# Patient Record
Sex: Male | Born: 1980 | Race: Black or African American | Hispanic: No | Marital: Single | Smoking: Former smoker
Health system: Southern US, Community
[De-identification: ages and names within clinical notes are randomized; demographics above are authoritative.]

## PROBLEM LIST (undated history)

## (undated) ENCOUNTER — Emergency Department: Admission: EM | Payer: Self-pay | Source: Home / Self Care

## (undated) DIAGNOSIS — R29898 Other symptoms and signs involving the musculoskeletal system: Secondary | ICD-10-CM

## (undated) DIAGNOSIS — J189 Pneumonia, unspecified organism: Secondary | ICD-10-CM

## (undated) DIAGNOSIS — G839 Paralytic syndrome, unspecified: Secondary | ICD-10-CM

## (undated) DIAGNOSIS — G8929 Other chronic pain: Secondary | ICD-10-CM

## (undated) DIAGNOSIS — M5030 Other cervical disc degeneration, unspecified cervical region: Secondary | ICD-10-CM

## (undated) HISTORY — DX: Other symptoms and signs involving the musculoskeletal system: R29.898

## (undated) HISTORY — PX: OTHER SURGICAL HISTORY: SHX169

## (undated) HISTORY — DX: Other chronic pain: G89.29

---

## 2006-12-21 ENCOUNTER — Emergency Department (HOSPITAL_COMMUNITY): Admission: EM | Admit: 2006-12-21 | Discharge: 2006-12-21 | Payer: Self-pay | Admitting: *Deleted

## 2009-08-21 ENCOUNTER — Emergency Department (HOSPITAL_COMMUNITY): Admission: EM | Admit: 2009-08-21 | Discharge: 2009-08-21 | Payer: Self-pay | Admitting: Emergency Medicine

## 2009-12-18 ENCOUNTER — Emergency Department (HOSPITAL_COMMUNITY): Admission: EM | Admit: 2009-12-18 | Discharge: 2009-12-18 | Payer: Self-pay | Admitting: Emergency Medicine

## 2009-12-23 ENCOUNTER — Emergency Department (HOSPITAL_COMMUNITY): Admission: EM | Admit: 2009-12-23 | Discharge: 2009-12-23 | Payer: Self-pay | Admitting: Emergency Medicine

## 2010-12-18 ENCOUNTER — Emergency Department (HOSPITAL_COMMUNITY)
Admission: EM | Admit: 2010-12-18 | Discharge: 2010-12-18 | Disposition: A | Discharge status: Home / Self Care | Payer: Self-pay | Attending: Emergency Medicine | Admitting: Emergency Medicine

## 2010-12-18 ENCOUNTER — Emergency Department (HOSPITAL_COMMUNITY): Payer: Self-pay

## 2010-12-18 DIAGNOSIS — M79609 Pain in unspecified limb: Secondary | ICD-10-CM | POA: Insufficient documentation

## 2010-12-18 DIAGNOSIS — M7989 Other specified soft tissue disorders: Secondary | ICD-10-CM | POA: Insufficient documentation

## 2010-12-18 DIAGNOSIS — W2203XA Walked into furniture, initial encounter: Secondary | ICD-10-CM | POA: Insufficient documentation

## 2010-12-18 DIAGNOSIS — S62339A Displaced fracture of neck of unspecified metacarpal bone, initial encounter for closed fracture: Secondary | ICD-10-CM | POA: Insufficient documentation

## 2011-01-16 LAB — URINE MICROSCOPIC-ADD ON

## 2011-01-16 LAB — URINALYSIS, ROUTINE W REFLEX MICROSCOPIC
Bilirubin Urine: NEGATIVE
Glucose, UA: NEGATIVE mg/dL
Hgb urine dipstick: NEGATIVE
Nitrite: NEGATIVE
Urobilinogen, UA: 1 mg/dL (ref 0.0–1.0)

## 2012-11-29 ENCOUNTER — Encounter (HOSPITAL_COMMUNITY): Payer: Self-pay | Admitting: Emergency Medicine

## 2012-11-29 ENCOUNTER — Emergency Department (HOSPITAL_COMMUNITY)
Admission: EM | Admit: 2012-11-29 | Discharge: 2012-11-29 | Disposition: A | Discharge status: Home / Self Care | Payer: Self-pay | Attending: Emergency Medicine | Admitting: Emergency Medicine

## 2012-11-29 DIAGNOSIS — R21 Rash and other nonspecific skin eruption: Secondary | ICD-10-CM | POA: Insufficient documentation

## 2012-11-29 DIAGNOSIS — L02519 Cutaneous abscess of unspecified hand: Secondary | ICD-10-CM | POA: Insufficient documentation

## 2012-11-29 DIAGNOSIS — L03119 Cellulitis of unspecified part of limb: Secondary | ICD-10-CM

## 2012-11-29 DIAGNOSIS — L03019 Cellulitis of unspecified finger: Secondary | ICD-10-CM | POA: Insufficient documentation

## 2012-11-29 MED ORDER — OXYCODONE-ACETAMINOPHEN 5-325 MG PO TABS: ORAL_TABLET | ORAL | Status: DC

## 2012-11-29 MED ORDER — SULFAMETHOXAZOLE-TMP DS 800-160 MG PO TABS
1.0000 | ORAL_TABLET | Freq: Once | ORAL | Status: AC
  Administered 2012-11-29: 1 via ORAL
  Filled 2012-11-29: qty 1

## 2012-11-29 MED ORDER — OXYCODONE-ACETAMINOPHEN 5-325 MG PO TABS
2.0000 | ORAL_TABLET | Freq: Once | ORAL | Status: AC
  Administered 2012-11-29: 2 via ORAL
  Filled 2012-11-29: qty 2

## 2012-11-29 MED ORDER — SULFAMETHOXAZOLE-TRIMETHOPRIM 800-160 MG PO TABS: 1.0000 | ORAL_TABLET | Freq: Two times a day (BID) | ORAL | Status: DC

## 2012-11-29 NOTE — ED Provider Notes (Signed)
History     CSN: [id number]  Arrival date & time 11/29/12  8295   First MD Initiated Contact with Patient 11/29/12 1104      Chief Complaint  Patient presents with  . Abscess    (Consider location/radiation/quality/duration/timing/severity/associated sxs/prior treatment) HPI  Vincent Stout is a 32 y.o. male complaining of enlarging abscess to right second digit proximal phalanx worsening over the course of 3 days. Patient cannot recall any trauma to the area. He denies fever, nausea or vomiting. Patient had an abscess in the right axilla recently. Mildly reduced range of motion. Patient tried to open it himself yesterday.  History reviewed. No pertinent past medical history.  History reviewed. No pertinent past surgical history.  History reviewed. No pertinent family history.  History  Substance Use Topics  . Smoking status: Never Smoker   . Smokeless tobacco: Not on file  . Alcohol Use: No      Review of Systems  Constitutional: Negative for fever.  Respiratory: Negative for shortness of breath.   Cardiovascular: Negative for chest pain.  Gastrointestinal: Negative for nausea, vomiting, abdominal pain and diarrhea.  Skin: Positive for rash.  All other systems reviewed and are negative.    Allergies  Review of patient's allergies indicates no known allergies.  Home Medications  No current outpatient prescriptions on file.  BP 124/79  Pulse 65  Temp 97.6 F (36.4 C) (Oral)  Resp 20  SpO2 97%  Physical Exam  Nursing note and vitals reviewed. Constitutional: He is oriented to person, place, and time. He appears well-developed and well-nourished. No distress.  HENT:  Head: Normocephalic.  Eyes: Conjunctivae normal and EOM are normal.  Cardiovascular: Normal rate.   Pulmonary/Chest: Effort normal. No stridor.  Musculoskeletal: Normal range of motion.  Neurological: He is alert and oriented to person, place, and time.  Skin:       Fluctuant abscess to  right second digit proximal phalanx on the radial side approximately 1.5 cm. Mild surrounding induration. Reduced range of motion of the PIP. Distal sensation is grossly intact and cap refill is less than 2 seconds.  Psychiatric: He has a normal mood and affect.    ED Course  Procedures (including critical care time)  INCISION AND DRAINAGE Performed by: Wynetta Emery Consent: Verbal consent obtained. Risks and benefits: risks, benefits and alternatives were discussed Type: abscess  Body area: Right first digit proximal phalanx on the radial side  Anesthesia: Digital block  Incision was made with a scalpel.  anesthetic: lidocaine 2% without epinephrine  Anesthetic total: 3 ml  Complexity: complex Blunt dissection to break up loculations  Drainage: purulent  Drainage amount: Half milliliter   Packing material: None   Patient tolerance: Patient tolerated the procedure well with no immediate complications.    Labs Reviewed - No data to display No results found.   1. Cellulitis and abscess of hand       MDM  1 cm abscess to right first digit on the lateral side. Doubt tenosynovitis or joint space involvement. Discussed case with attending  who agrees with care plan to perform incision and drainage. I indeed yielded scant amount of purulent  material. Abscess was cultured. Patient will receive his first dose of Bactrim in ED. Explicit return precautions given.  Filed Vitals:   11/29/12 1001  BP: 124/79  Pulse: 65  Temp: 97.6 F (36.4 C)  TempSrc: Oral  Resp: 20  SpO2: 97%     Pt verbalized understanding and agrees with care plan.  Outpatient follow-up and return precautions given.    New Prescriptions   OXYCODONE-ACETAMINOPHEN (PERCOCET/ROXICET) 5-325 MG PER TABLET    1 to 2 tabs PO q6hrs  PRN for pain   SULFAMETHOXAZOLE-TRIMETHOPRIM (SEPTRA DS) 800-160 MG PER TABLET    Take 1 tablet by mouth every 12 (twelve) hours.        Wynetta Emery,  PA-C 11/29/12 1217

## 2012-11-29 NOTE — ED Notes (Signed)
Pt c/o right index finger abscess x 3 days with pain and swelling

## 2012-12-02 ENCOUNTER — Telehealth (HOSPITAL_COMMUNITY): Payer: Self-pay | Admitting: Emergency Medicine

## 2012-12-02 LAB — CULTURE, ROUTINE-ABSCESS: Special Requests: NORMAL

## 2012-12-02 NOTE — ED Provider Notes (Signed)
Medical screening examination/treatment/procedure(s) were performed by non-physician practitioner and as supervising physician I was immediately available for consultation/collaboration.   Jamari Moten Y. Tiffay Pinette, MD 12/02/12 1440 

## 2012-12-02 NOTE — ED Notes (Signed)
Results called from [facility].  Abscess Rt Hand -> (+) Moderate MRSA.  Rx given in ED for Sulfa-Trimeth -> sensitive to the same.  Call and notify pt.  General FYI set for MRSA Hx.

## 2012-12-06 ENCOUNTER — Telehealth (HOSPITAL_COMMUNITY): Payer: Self-pay | Admitting: Emergency Medicine

## 2012-12-06 NOTE — ED Notes (Signed)
Unable to contact patient via phone. Sent letter. °

## 2013-01-17 ENCOUNTER — Telehealth (HOSPITAL_COMMUNITY): Payer: Self-pay | Admitting: Emergency Medicine

## 2013-01-17 NOTE — ED Notes (Signed)
No response to letter sent after 30 days. Chart sent to Medical Records. °

## 2014-09-30 ENCOUNTER — Other Ambulatory Visit: Payer: Self-pay

## 2014-09-30 ENCOUNTER — Encounter (HOSPITAL_COMMUNITY): Payer: Self-pay | Admitting: Emergency Medicine

## 2014-09-30 ENCOUNTER — Emergency Department (HOSPITAL_COMMUNITY)

## 2014-09-30 ENCOUNTER — Emergency Department (HOSPITAL_COMMUNITY)
Admission: EM | Admit: 2014-09-30 | Discharge: 2014-09-30 | Disposition: A | Discharge status: Home / Self Care | Attending: Emergency Medicine | Admitting: Emergency Medicine

## 2014-09-30 DIAGNOSIS — Z792 Long term (current) use of antibiotics: Secondary | ICD-10-CM | POA: Diagnosis not present

## 2014-09-30 DIAGNOSIS — Z79899 Other long term (current) drug therapy: Secondary | ICD-10-CM | POA: Insufficient documentation

## 2014-09-30 DIAGNOSIS — R079 Chest pain, unspecified: Secondary | ICD-10-CM | POA: Diagnosis present

## 2014-09-30 DIAGNOSIS — Z87891 Personal history of nicotine dependence: Secondary | ICD-10-CM | POA: Insufficient documentation

## 2014-09-30 DIAGNOSIS — Z7982 Long term (current) use of aspirin: Secondary | ICD-10-CM | POA: Insufficient documentation

## 2014-09-30 LAB — CBC WITH DIFFERENTIAL/PLATELET
Basophils Absolute: 0 K/uL (ref 0.0–0.1)
Basophils Relative: 1 % (ref 0–1)
Eosinophils Absolute: 0.1 K/uL (ref 0.0–0.7)
Eosinophils Relative: 2 % (ref 0–5)
HCT: 44.1 % (ref 39.0–52.0)
Hemoglobin: 15.3 g/dL (ref 13.0–17.0)
Lymphocytes Relative: 45 % (ref 12–46)
Lymphs Abs: 3.7 K/uL (ref 0.7–4.0)
MCH: 30.3 pg (ref 26.0–34.0)
MCHC: 34.7 g/dL (ref 30.0–36.0)
MCV: 87.3 fL (ref 78.0–100.0)
Monocytes Absolute: 0.7 K/uL (ref 0.1–1.0)
Monocytes Relative: 8 % (ref 3–12)
Neutro Abs: 3.6 K/uL (ref 1.7–7.7)
Neutrophils Relative %: 44 % (ref 43–77)
Platelets: 211 K/uL (ref 150–400)
RBC: 5.05 MIL/uL (ref 4.22–5.81)
RDW: 12.8 % (ref 11.5–15.5)
WBC: 8.2 K/uL (ref 4.0–10.5)

## 2014-09-30 LAB — TROPONIN I

## 2014-09-30 LAB — BASIC METABOLIC PANEL WITH GFR
Anion gap: 11 (ref 5–15)
BUN: 10 mg/dL (ref 6–23)
CO2: 27 meq/L (ref 19–32)
Calcium: 9.4 mg/dL (ref 8.4–10.5)
Chloride: 103 meq/L (ref 96–112)
Creatinine, Ser: 1.25 mg/dL (ref 0.50–1.35)
GFR calc Af Amer: 87 mL/min — ABNORMAL LOW
GFR calc non Af Amer: 75 mL/min — ABNORMAL LOW
Glucose, Bld: 90 mg/dL (ref 70–99)
Potassium: 4 meq/L (ref 3.7–5.3)
Sodium: 141 meq/L (ref 137–147)

## 2014-09-30 MED ORDER — ACETAMINOPHEN 500 MG PO TABS
1000.0000 mg | ORAL_TABLET | Freq: Once | ORAL | Status: AC
Start: 2014-09-30 — End: 2014-09-30
  Administered 2014-09-30: 1000 mg via ORAL

## 2014-09-30 MED ORDER — IBUPROFEN 800 MG PO TABS
800.0000 mg | ORAL_TABLET | Freq: Once | ORAL | Status: AC
  Administered 2014-09-30: 800 mg via ORAL

## 2014-09-30 NOTE — Discharge Instructions (Signed)
Tests were normal. Tylenol and/or ibuprofen for headache.

## 2014-09-30 NOTE — ED Notes (Signed)
Patient c/o mid-sternal chest pain that started PTA. States that he "was stressing" when it started. Denies shortness of breath, nausea. Pain free at present. C/o headache x 2 weeks as well.

## 2014-09-30 NOTE — ED Provider Notes (Signed)
CSN: [id number]     Arrival date & time 09/30/14  1316 History  This chart was scribed for Donnetta HutchingBrian Clois Treanor, MD by Tonye RoyaltyJoshua Chen, ED Scribe. This patient was seen in room APA18/APA18 and the patient's care was started at 2:16 PM.    Chief Complaint  Patient presents with  . Chest Pain   The history is provided by the patient. No language interpreter was used.    HPI Comments: Vincent Stout is a 33 y.o. male who presents to the Emergency Department complaining of pain to his head and chest with onset at noon today; he notes he was "stressing" right before. He states his chest then felt tight and he felt dizzy and had difficulty breathing. At this time he states his symptoms are improved, but he still has a headache and slight pain to his mid chest. He states he was administered ASA. He states he ate a little today, but not a normal amount. He notes similar symptoms previously 5 years ago for which [facility] and was diagnosed with panic attack; he notes his aunt died soon before that. He states that he smokes. He denies FHx of cardiac disease. He denies particular exertion in the past few weeks.   History reviewed. No pertinent past medical history. History reviewed. No pertinent past surgical history. No family history on file. History  Substance Use Topics  . Smoking status: Former Games developermoker  . Smokeless tobacco: Not on file  . Alcohol Use: No    Review of Systems A complete 10 system review of systems was obtained and all systems are negative except as noted in the HPI and PMH.    Allergies  Review of patient's allergies indicates no known allergies.  Home Medications   Prior to Admission medications   Medication Sig Start Date End Date Taking? Authorizing Provider  aspirin 325 MG tablet Take 650 mg by mouth every 6 (six) hours as needed for moderate pain or headache.   Yes Historical Provider, MD  oxyCODONE-acetaminophen (PERCOCET/ROXICET) 5-325 MG per tablet 1 to 2  tabs PO q6hrs  PRN for pain Patient not taking: Reported on 09/30/2014 11/29/12   Joni ReiningNicole Pisciotta, PA-C  sulfamethoxazole-trimethoprim (SEPTRA DS) 800-160 MG per tablet Take 1 tablet by mouth every 12 (twelve) hours. Patient not taking: Reported on 09/30/2014 11/29/12   Joni ReiningNicole Pisciotta, PA-C   BP 126/90 mmHg  Pulse 73  Temp(Src) 98.1 F (36.7 C) (Oral)  Resp 16  Ht 5\' 11"  (1.803 m)  Wt 185 lb (83.915 kg)  BMI 25.81 kg/m2  SpO2 99% Physical Exam  Constitutional: He is oriented to person, place, and time. He appears well-developed and well-nourished.  HENT:  Head: Normocephalic and atraumatic.  Eyes: Conjunctivae and EOM are normal. Pupils are equal, round, and reactive to light.  Neck: Normal range of motion. Neck supple.  Cardiovascular: Normal rate, regular rhythm and normal heart sounds.   Pulmonary/Chest: Effort normal and breath sounds normal.  Abdominal: Soft. Bowel sounds are normal.  Musculoskeletal: Normal range of motion.  Neurological: He is alert and oriented to person, place, and time.  Skin: Skin is warm and dry.  Psychiatric: He has a normal mood and affect. His behavior is normal.  Nursing note and vitals reviewed.   ED Course  Procedures (including critical care time)  DIAGNOSTIC STUDIES: Oxygen Saturation is 99% on room air, normal by my interpretation.    COORDINATION OF CARE: 2:20 PM Discussed with patient that it is unlikely his pain  is cardiac related since he has few risk factors, but will run basic cardiac screening test. The patient agrees with the plan and has no further questions at this time.   Labs Review Labs Reviewed  BASIC METABOLIC PANEL - Abnormal; Notable for the following:    GFR calc non Af Amer 75 (*)    GFR calc Af Amer 87 (*)    All other components within normal limits  CBC WITH DIFFERENTIAL  TROPONIN I    Imaging Review Dg Chest 2 View  09/30/2014   CLINICAL DATA:  Chest tightness and headaches since last night.  EXAM: CHEST  2  VIEW  COMPARISON:  12/23/2009  FINDINGS: Heart size is normal. Mediastinal shadows are normal. The lungs are clear. No bronchial thickening. No infiltrate, mass, effusion or collapse. Pulmonary vascularity is normal. No bony abnormality.  IMPRESSION: Normal chest.   Electronically Signed   By: Paulina FusiMark  Shogry M.D.   On: 09/30/2014 16:11     EKG Interpretation None      Date: 09/30/2014  Rate: 78  Rhythm: normal sinus rhythm  QRS Axis: normal  Intervals: normal  ST/T Wave abnormalities: normal  Conduction Disutrbances: none  Narrative Interpretation: unremarkable    MDM   Final diagnoses:  Chest pain    Patient is very low risk for acute coronary syndrome or pulmonary embolism. Screening tests normal. He is in no acute distress.    Donnetta HutchingBrian Jordana Dugue, MD 09/30/14 [phone]

## 2014-09-30 NOTE — ED Notes (Signed)
Patient given discharge instruction, verbalized understand. Patient ambulatory out of the department.  

## 2015-06-07 ENCOUNTER — Emergency Department (HOSPITAL_COMMUNITY)
Admission: EM | Admit: 2015-06-07 | Discharge: 2015-06-07 | Discharge status: Against Medical Advice | Attending: Emergency Medicine | Admitting: Emergency Medicine

## 2015-06-07 ENCOUNTER — Encounter (HOSPITAL_COMMUNITY): Payer: Self-pay | Admitting: *Deleted

## 2015-06-07 DIAGNOSIS — Z720 Tobacco use: Secondary | ICD-10-CM | POA: Insufficient documentation

## 2015-06-07 DIAGNOSIS — R51 Headache: Secondary | ICD-10-CM | POA: Insufficient documentation

## 2015-06-07 DIAGNOSIS — R109 Unspecified abdominal pain: Secondary | ICD-10-CM | POA: Insufficient documentation

## 2015-06-07 LAB — URINALYSIS, ROUTINE W REFLEX MICROSCOPIC
Bilirubin Urine: NEGATIVE
Glucose, UA: NEGATIVE mg/dL
Hgb urine dipstick: NEGATIVE
KETONES UR: NEGATIVE mg/dL
LEUKOCYTES UA: NEGATIVE
NITRITE: NEGATIVE
PH: 7.5 (ref 5.0–8.0)
PROTEIN: NEGATIVE mg/dL
Specific Gravity, Urine: 1.011 (ref 1.005–1.030)
Urobilinogen, UA: 0.2 mg/dL (ref 0.0–1.0)

## 2015-06-07 NOTE — ED Notes (Signed)
Pt states that he has been having right sided flank pain since 1600 today. Also c/o migraine.

## 2015-06-07 NOTE — ED Notes (Signed)
Pt approached nurse first and stated he had to get home to his daughter, pt encourage to stay but pt still chose to leave.

## 2015-06-07 NOTE — ED Notes (Signed)
Called pt in lobby x 2 no answer.

## 2016-03-11 ENCOUNTER — Emergency Department (HOSPITAL_COMMUNITY)
Admission: EM | Admit: 2016-03-11 | Discharge: 2016-03-11 | Disposition: A | Discharge status: Home / Self Care | Payer: Self-pay | Attending: Emergency Medicine | Admitting: Emergency Medicine

## 2016-03-11 ENCOUNTER — Emergency Department (HOSPITAL_COMMUNITY): Payer: Self-pay

## 2016-03-11 ENCOUNTER — Encounter (HOSPITAL_COMMUNITY): Payer: Self-pay

## 2016-03-11 DIAGNOSIS — X500XXA Overexertion from strenuous movement or load, initial encounter: Secondary | ICD-10-CM | POA: Insufficient documentation

## 2016-03-11 DIAGNOSIS — Y990 Civilian activity done for income or pay: Secondary | ICD-10-CM | POA: Insufficient documentation

## 2016-03-11 DIAGNOSIS — F17200 Nicotine dependence, unspecified, uncomplicated: Secondary | ICD-10-CM | POA: Insufficient documentation

## 2016-03-11 DIAGNOSIS — S39012A Strain of muscle, fascia and tendon of lower back, initial encounter: Secondary | ICD-10-CM | POA: Insufficient documentation

## 2016-03-11 DIAGNOSIS — Y9289 Other specified places as the place of occurrence of the external cause: Secondary | ICD-10-CM | POA: Insufficient documentation

## 2016-03-11 DIAGNOSIS — Y9389 Activity, other specified: Secondary | ICD-10-CM | POA: Insufficient documentation

## 2016-03-11 DIAGNOSIS — S0990XA Unspecified injury of head, initial encounter: Secondary | ICD-10-CM | POA: Insufficient documentation

## 2016-03-11 MED ORDER — HYDROCODONE-ACETAMINOPHEN 5-325 MG PO TABS: 1.0000 | ORAL_TABLET | Freq: Four times a day (QID) | ORAL | Status: DC | PRN

## 2016-03-11 MED ORDER — OXYCODONE-ACETAMINOPHEN 5-325 MG PO TABS
1.0000 | ORAL_TABLET | Freq: Once | ORAL | Status: AC
  Administered 2016-03-11: 1 via ORAL
  Filled 2016-03-11: qty 1

## 2016-03-11 MED ORDER — IBUPROFEN 800 MG PO TABS: 800.0000 mg | ORAL_TABLET | Freq: Three times a day (TID) | ORAL | Status: DC

## 2016-03-11 MED ORDER — DIAZEPAM 5 MG PO TABS: 5.0000 mg | ORAL_TABLET | Freq: Four times a day (QID) | ORAL | Status: DC | PRN

## 2016-03-11 MED ORDER — KETOROLAC TROMETHAMINE 60 MG/2ML IM SOLN
60.0000 mg | Freq: Once | INTRAMUSCULAR | Status: AC
  Administered 2016-03-11: 60 mg via INTRAMUSCULAR
  Filled 2016-03-11: qty 2

## 2016-03-11 NOTE — ED Provider Notes (Signed)
CSN: [id number]     Arrival date & time 03/11/16  0136 History   First MD Initiated Contact with Patient 03/11/16 0234     Chief Complaint  Patient presents with  . Back Pain     (Consider location/radiation/quality/duration/timing/severity/associated sxs/prior Treatment) HPI Vincent Stout is a 35 y.o. male presents to ED with complaint back pain. Pt states he was picking up something heavy 2 wks ago when he felt pain in lower back. Pain does not radiate. No numbness or weakness in extremities. He denies abdominal pain. No fever. Pain is worsened with movement. He has been taking ibuprofen 800 mg with no relief. Patient also states he takes on also as muscle relaxant which has not helped. He has tried heating pads with no relief of his symptoms. He states he is able to walk, but states that it is very painful and he is limping. No trouble controlling bowels or bladder. Pain is in the lower back, across entire back. No history of back issues  History reviewed. No pertinent past medical history. History reviewed. No pertinent past surgical history. History reviewed. No pertinent family history. Social History  Substance Use Topics  . Smoking status: Current Some Day Smoker  . Smokeless tobacco: None  . Alcohol Use: Yes     Comment: social    Review of Systems  Constitutional: Negative for fever and chills.  Respiratory: Negative for cough, chest tightness and shortness of breath.   Cardiovascular: Negative for chest pain, palpitations and leg swelling.  Gastrointestinal: Negative for nausea, vomiting, abdominal pain, diarrhea and abdominal distention.  Genitourinary: Negative for dysuria, urgency, frequency and hematuria.  Musculoskeletal: Positive for back pain. Negative for neck pain and neck stiffness.  Skin: Negative for rash.  Allergic/Immunologic: Negative for immunocompromised state.  Neurological: Positive for headaches. Negative for dizziness, weakness, light-headedness and  numbness.  All other systems reviewed and are negative.     Allergies  Review of patient's allergies indicates no known allergies.  Home Medications   Prior to Admission medications   Medication Sig Start Date End Date Taking? Authorizing Provider  aspirin 325 MG tablet Take 650 mg by mouth every 6 (six) hours as needed for moderate pain or headache.    Historical Provider, MD   BP 112/85 mmHg  Pulse 75  Temp(Src) 98.7 F (37.1 C) (Oral)  Resp 18  Ht 6' (1.829 m)  Wt 78.926 kg  BMI 23.59 kg/m2  SpO2 100% Physical Exam  Constitutional: He is oriented to person, place, and time. He appears well-developed and well-nourished. No distress.  HENT:  Head: Normocephalic and atraumatic.  Eyes: Conjunctivae are normal.  Neck: Neck supple.  Cardiovascular: Normal rate, regular rhythm and normal heart sounds.   Pulmonary/Chest: Effort normal. No respiratory distress. He has no wheezes. He has no rales.  Abdominal: Soft. Bowel sounds are normal. He exhibits no distension. There is no tenderness. There is no rebound.  Musculoskeletal: He exhibits no edema.  Midline and bilateral paravertebral lumbar spine tenderness. Pain with bilateral leg raise. No pain radiating down buttock or legs.  Neurological: He is alert and oriented to person, place, and time.  5/5 and equal lower extremity strength. 2+ and equal patellar reflexes bilaterally. Pt able to dorsiflex bilateral toes and feet with good strength against resistance. Equal sensation bilaterally over thighs and lower legs.   Skin: Skin is warm and dry.  Nursing note and vitals reviewed.   ED Course  Procedures (including critical care time) Labs Review Labs Reviewed -  No data to display  Imaging Review Dg Lumbar Spine Complete  03/11/2016  CLINICAL DATA:  Severe back pain since heavy lifting injury at work 2 weeks ago. EXAM: LUMBAR SPINE - COMPLETE 4+ VIEW COMPARISON:  None. FINDINGS: There is no evidence of lumbar spine fracture.  Alignment is normal. Intervertebral disc spaces are maintained. IMPRESSION: Negative. Electronically Signed   By: Burman NievesWilliam  Stevens M.D.   On: 03/11/2016 03:39   I have personally reviewed and evaluated these images and lab results as part of my medical decision-making.   EKG Interpretation None      MDM   Final diagnoses:  Lumbar strain, initial encounter   Patient emergency department with lower back pain, pain onset after lifting heavy object at work. He has no signs or symptoms to suggest cauda equina at this time. He has no pain radiation down his legs, no numbness or weakness in the extremities. No fever. No urinary symptoms. No abdominal pain. X-ray of the lumbar spine obtained and is negative. Differential includes lumbar strain versus degenerative disc disease vs muscle spasms. Patient treated in emergency department with 60 mg of Toradol IM and 1 Percocet. He states pain somewhat improved. We'll discharge home with muscle relaxant, Norco, ibuprofen. I will give him set of exercises to start doing. Follow with primary care doctor. Return precautions discussed.  Filed Vitals:   03/11/16 0155 03/11/16 0158 03/11/16 0414  BP: 112/85  115/58  Pulse: 75  60  Temp: 98.7 F (37.1 C)  98.2 F (36.8 C)  TempSrc: Oral  Oral  Resp: 18  16  Height: 6' (1.829 m)    Weight:  78.926 kg   SpO2: 100%  100%      Jaynie Crumbleatyana Stevi Hollinshead, PA-C 03/11/16 0539  Lyndal Pulleyaniel Knott, MD 03/11/16 [phone]

## 2016-03-11 NOTE — Discharge Instructions (Signed)
Take/continue taking ibuprofen for pain and inflammation. Take Norco for severe pain only. Take Valium for muscle spasms. Continue to try heating pads, move around, stretch. When pain slightly improved, start exercise as listed below. If not improving, please follow-up with her primary care doctor. Return to the ER if you're having weakness or numbness in her legs, trouble controlling her bowels or bladder, fever, abdominal pain, or any new concerning symptoms   Low Back Strain With Rehab A strain is an injury in which a tendon or muscle is torn. The muscles and tendons of the lower back are vulnerable to strains. However, these muscles and tendons are very strong and require a great force to be injured. Strains are classified into three categories. Grade 1 strains cause pain, but the tendon is not lengthened. Grade 2 strains include a lengthened ligament, due to the ligament being stretched or partially ruptured. With grade 2 strains there is still function, although the function may be decreased. Grade 3 strains involve a complete tear of the tendon or muscle, and function is usually impaired. SYMPTOMS   Pain in the lower back.  Pain that affects one side more than the other.  Pain that gets worse with movement and may be felt in the hip, buttocks, or back of the thigh.  Muscle spasms of the muscles in the back.  Swelling along the muscles of the back.  Loss of strength of the back muscles.  Crackling sound (crepitation) when the muscles are touched. CAUSES  Lower back strains occur when a force is placed on the muscles or tendons that is greater than they can handle. Common causes of injury include:  Prolonged overuse of the muscle-tendon units in the lower back, usually from incorrect posture.  A single violent injury or force applied to the back. RISK INCREASES WITH:  Sports that involve twisting forces on the spine or a lot of bending at the waist (football, rugby, weightlifting,  bowling, golf, tennis, speed skating, racquetball, swimming, running, gymnastics, diving).  Poor strength and flexibility.  Failure to warm up properly before activity.  Family history of lower back pain or disk disorders.  Previous back injury or surgery (especially fusion).  Poor posture with lifting, especially heavy objects.  Prolonged sitting, especially with poor posture. PREVENTION   Learn and use proper posture when sitting or lifting (maintain proper posture when sitting, lift using the knees and legs, not at the waist).  Warm up and stretch properly before activity.  Allow for adequate recovery between workouts.  Maintain physical fitness:  Strength, flexibility, and endurance.  Cardiovascular fitness. PROGNOSIS  If treated properly, lower back strains usually heal within 6 weeks. RELATED COMPLICATIONS   Recurring symptoms, resulting in a chronic problem.  Chronic inflammation, scarring, and partial muscle-tendon tear.  Delayed healing or resolution of symptoms.  Prolonged disability. TREATMENT  Treatment first involves the use of ice and medicine, to reduce pain and inflammation. The use of strengthening and stretching exercises may help reduce pain with activity. These exercises may be performed at home or with a therapist. Severe injuries may require referral to a therapist for further evaluation and treatment, such as ultrasound. Your caregiver may advise that you wear a back brace or corset, to help reduce pain and discomfort. Often, prolonged bed rest results in greater harm then benefit. Corticosteroid injections may be recommended. However, these should be reserved for the most serious cases. It is important to avoid using your back when lifting objects. At night, sleep on  your back on a firm mattress with a pillow placed under your knees. If non-surgical treatment is unsuccessful, surgery may be needed.  MEDICATION   If pain medicine is needed, nonsteroidal  anti-inflammatory medicines (aspirin and ibuprofen), or other minor pain relievers (acetaminophen), are often advised.  Do not take pain medicine for 7 days before surgery.  Prescription pain relievers may be given, if your caregiver thinks they are needed. Use only as directed and only as much as you need.  Ointments applied to the skin may be helpful.  Corticosteroid injections may be given by your caregiver. These injections should be reserved for the most serious cases, because they may only be given a certain number of times. HEAT AND COLD  Cold treatment (icing) should be applied for 10 to 15 minutes every 2 to 3 hours for inflammation and pain, and immediately after activity that aggravates your symptoms. Use ice packs or an ice massage.  Heat treatment may be used before performing stretching and strengthening activities prescribed by your caregiver, physical therapist, or athletic trainer. Use a heat pack or a warm water soak. SEEK MEDICAL CARE IF:   Symptoms get worse or do not improve in 2 to 4 weeks, despite treatment.  You develop numbness, weakness, or loss of bowel or bladder function.  New, unexplained symptoms develop. (Drugs used in treatment may produce side effects.) EXERCISES  RANGE OF MOTION (ROM) AND STRETCHING EXERCISES - Low Back Strain Most people with lower back pain will find that their symptoms get worse with excessive bending forward (flexion) or arching at the lower back (extension). The exercises which will help resolve your symptoms will focus on the opposite motion.  Your physician, physical therapist or athletic trainer will help you determine which exercises will be most helpful to resolve your lower back pain. Do not complete any exercises without first consulting with your caregiver. Discontinue any exercises which make your symptoms worse until you speak to your caregiver.  If you have pain, numbness or tingling which travels down into your buttocks,  leg or foot, the goal of the therapy is for these symptoms to move closer to your back and eventually resolve. Sometimes, these leg symptoms will get better, but your lower back pain may worsen. This is typically an indication of progress in your rehabilitation. Be very alert to any changes in your symptoms and the activities in which you participated in the 24 hours prior to the change. Sharing this information with your caregiver will allow him/her to most efficiently treat your condition.  These exercises may help you when beginning to rehabilitate your injury. Your symptoms may resolve with or without further involvement from your physician, physical therapist or athletic trainer. While completing these exercises, remember:  Restoring tissue flexibility helps normal motion to return to the joints. This allows healthier, less painful movement and activity.  An effective stretch should be held for at least 30 seconds.  A stretch should never be painful. You should only feel a gentle lengthening or release in the stretched tissue. FLEXION RANGE OF MOTION AND STRETCHING EXERCISES: STRETCH - Flexion, Single Knee to Chest   Lie on a firm bed or floor with both legs extended in front of you.  Keeping one leg in contact with the floor, bring your opposite knee to your chest. Hold your leg in place by either grabbing behind your thigh or at your knee.  Pull until you feel a gentle stretch in your lower back. Hold __________ seconds.  Slowly release your grasp and repeat the exercise with the opposite side. Repeat __________ times. Complete this exercise __________ times per day.  STRETCH - Flexion, Double Knee to Chest   Lie on a firm bed or floor with both legs extended in front of you.  Keeping one leg in contact with the floor, bring your opposite knee to your chest.  Tense your stomach muscles to support your back and then lift your other knee to your chest. Hold your legs in place by either  grabbing behind your thighs or at your knees.  Pull both knees toward your chest until you feel a gentle stretch in your lower back. Hold __________ seconds.  Tense your stomach muscles and slowly return one leg at a time to the floor. Repeat __________ times. Complete this exercise __________ times per day.  STRETCH - Low Trunk Rotation  Lie on a firm bed or floor. Keeping your legs in front of you, bend your knees so they are both pointed toward the ceiling and your feet are flat on the floor.  Extend your arms out to the side. This will stabilize your upper body by keeping your shoulders in contact with the floor.  Gently and slowly drop both knees together to one side until you feel a gentle stretch in your lower back. Hold for __________ seconds.  Tense your stomach muscles to support your lower back as you bring your knees back to the starting position. Repeat the exercise to the other side. Repeat __________ times. Complete this exercise __________ times per day  EXTENSION RANGE OF MOTION AND FLEXIBILITY EXERCISES: STRETCH - Extension, Prone on Elbows   Lie on your stomach on the floor, a bed will be too soft. Place your palms about shoulder width apart and at the height of your head.  Place your elbows under your shoulders. If this is too painful, stack pillows under your chest.  Allow your body to relax so that your hips drop lower and make contact more completely with the floor.  Hold this position for __________ seconds.  Slowly return to lying flat on the floor. Repeat __________ times. Complete this exercise __________ times per day.  RANGE OF MOTION - Extension, Prone Press Ups  Lie on your stomach on the floor, a bed will be too soft. Place your palms about shoulder width apart and at the height of your head.  Keeping your back as relaxed as possible, slowly straighten your elbows while keeping your hips on the floor. You may adjust the placement of your hands to  maximize your comfort. As you gain motion, your hands will come more underneath your shoulders.  Hold this position __________ seconds.  Slowly return to lying flat on the floor. Repeat __________ times. Complete this exercise __________ times per day.  RANGE OF MOTION- Quadruped, Neutral Spine   Assume a hands and knees position on a firm surface. Keep your hands under your shoulders and your knees under your hips. You may place padding under your knees for comfort.  Drop your head and point your tail bone toward the ground below you. This will round out your lower back like an angry cat. Hold this position for __________ seconds.  Slowly lift your head and release your tail bone so that your back sags into a large arch, like an old horse.  Hold this position for __________ seconds.  Repeat this until you feel limber in your lower back.  Now, find your "sweet spot." This will  be the most comfortable position somewhere between the two previous positions. This is your neutral spine. Once you have found this position, tense your stomach muscles to support your lower back.  Hold this position for __________ seconds. Repeat __________ times. Complete this exercise __________ times per day.  STRENGTHENING EXERCISES - Low Back Strain These exercises may help you when beginning to rehabilitate your injury. These exercises should be done near your "sweet spot." This is the neutral, low-back arch, somewhere between fully rounded and fully arched, that is your least painful position. When performed in this safe range of motion, these exercises can be used for people who have either a flexion or extension based injury. These exercises may resolve your symptoms with or without further involvement from your physician, physical therapist or athletic trainer. While completing these exercises, remember:   Muscles can gain both the endurance and the strength needed for everyday activities through controlled  exercises.  Complete these exercises as instructed by your physician, physical therapist or athletic trainer. Increase the resistance and repetitions only as guided.  You may experience muscle soreness or fatigue, but the pain or discomfort you are trying to eliminate should never worsen during these exercises. If this pain does worsen, stop and make certain you are following the directions exactly. If the pain is still present after adjustments, discontinue the exercise until you can discuss the trouble with your caregiver. STRENGTHENING - Deep Abdominals, Pelvic Tilt  Lie on a firm bed or floor. Keeping your legs in front of you, bend your knees so they are both pointed toward the ceiling and your feet are flat on the floor.  Tense your lower abdominal muscles to press your lower back into the floor. This motion will rotate your pelvis so that your tail bone is scooping upwards rather than pointing at your feet or into the floor.  With a gentle tension and even breathing, hold this position for __________ seconds. Repeat __________ times. Complete this exercise __________ times per day.  STRENGTHENING - Abdominals, Crunches   Lie on a firm bed or floor. Keeping your legs in front of you, bend your knees so they are both pointed toward the ceiling and your feet are flat on the floor. Cross your arms over your chest.  Slightly tip your chin down without bending your neck.  Tense your abdominals and slowly lift your trunk high enough to just clear your shoulder blades. Lifting higher can put excessive stress on the lower back and does not further strengthen your abdominal muscles.  Control your return to the starting position. Repeat __________ times. Complete this exercise __________ times per day.  STRENGTHENING - Quadruped, Opposite UE/LE Lift   Assume a hands and knees position on a firm surface. Keep your hands under your shoulders and your knees under your hips. You may place padding  under your knees for comfort.  Find your neutral spine and gently tense your abdominal muscles so that you can maintain this position. Your shoulders and hips should form a rectangle that is parallel with the floor and is not twisted.  Keeping your trunk steady, lift your right hand no higher than your shoulder and then your left leg no higher than your hip. Make sure you are not holding your breath. Hold this position __________ seconds.  Continuing to keep your abdominal muscles tense and your back steady, slowly return to your starting position. Repeat with the opposite arm and leg. Repeat __________ times. Complete this exercise __________ times  per day.  STRENGTHENING - Lower Abdominals, Double Knee Lift  Lie on a firm bed or floor. Keeping your legs in front of you, bend your knees so they are both pointed toward the ceiling and your feet are flat on the floor.  Tense your abdominal muscles to brace your lower back and slowly lift both of your knees until they come over your hips. Be certain not to hold your breath.  Hold __________ seconds. Using your abdominal muscles, return to the starting position in a slow and controlled manner. Repeat __________ times. Complete this exercise __________ times per day.  POSTURE AND BODY MECHANICS CONSIDERATIONS - Low Back Strain Keeping correct posture when sitting, standing or completing your activities will reduce the stress put on different body tissues, allowing injured tissues a chance to heal and limiting painful experiences. The following are general guidelines for improved posture. Your physician or physical therapist will provide you with any instructions specific to your needs. While reading these guidelines, remember:  The exercises prescribed by your provider will help you have the flexibility and strength to maintain correct postures.  The correct posture provides the best environment for your joints to work. All of your joints have less  wear and tear when properly supported by a spine with good posture. This means you will experience a healthier, less painful body.  Correct posture must be practiced with all of your activities, especially prolonged sitting and standing. Correct posture is as important when doing repetitive low-stress activities (typing) as it is when doing a single heavy-load activity (lifting). RESTING POSITIONS Consider which positions are most painful for you when choosing a resting position. If you have pain with flexion-based activities (sitting, bending, stooping, squatting), choose a position that allows you to rest in a less flexed posture. You would want to avoid curling into a fetal position on your side. If your pain worsens with extension-based activities (prolonged standing, working overhead), avoid resting in an extended position such as sleeping on your stomach. Most people will find more comfort when they rest with their spine in a more neutral position, neither too rounded nor too arched. Lying on a non-sagging bed on your side with a pillow between your knees, or on your back with a pillow under your knees will often provide some relief. Keep in mind, being in any one position for a prolonged period of time, no matter how correct your posture, can still lead to stiffness. PROPER SITTING POSTURE In order to minimize stress and discomfort on your spine, you must sit with correct posture. Sitting with good posture should be effortless for a healthy body. Returning to good posture is a gradual process. Many people can work toward this most comfortably by using various supports until they have the flexibility and strength to maintain this posture on their own. When sitting with proper posture, your ears will fall over your shoulders and your shoulders will fall over your hips. You should use the back of the chair to support your upper back. Your lower back will be in a neutral position, just slightly arched. You  may place a small pillow or folded towel at the base of your lower back for support.  When working at a desk, create an environment that supports good, upright posture. Without extra support, muscles tire, which leads to excessive strain on joints and other tissues. Keep these recommendations in mind: CHAIR:  A chair should be able to slide under your desk when your back makes contact  with the back of the chair. This allows you to work closely.  The chair's height should allow your eyes to be level with the upper part of your monitor and your hands to be slightly lower than your elbows. BODY POSITION  Your feet should make contact with the floor. If this is not possible, use a foot rest.  Keep your ears over your shoulders. This will reduce stress on your neck and lower back. INCORRECT SITTING POSTURES  If you are feeling tired and unable to assume a healthy sitting posture, do not slouch or slump. This puts excessive strain on your back tissues, causing more damage and pain. Healthier options include:  Using more support, like a lumbar pillow.  Switching tasks to something that requires you to be upright or walking.  Talking a brief walk.  Lying down to rest in a neutral-spine position. PROLONGED STANDING WHILE SLIGHTLY LEANING FORWARD  When completing a task that requires you to lean forward while standing in one place for a long time, place either foot up on a stationary 2-4 inch high object to help maintain the best posture. When both feet are on the ground, the lower back tends to lose its slight inward curve. If this curve flattens (or becomes too large), then the back and your other joints will experience too much stress, tire more quickly, and can cause pain. CORRECT STANDING POSTURES Proper standing posture should be assumed with all daily activities, even if they only take a few moments, like when brushing your teeth. As in sitting, your ears should fall over your shoulders and  your shoulders should fall over your hips. You should keep a slight tension in your abdominal muscles to brace your spine. Your tailbone should point down to the ground, not behind your body, resulting in an over-extended swayback posture.  INCORRECT STANDING POSTURES  Common incorrect standing postures include a forward head, locked knees and/or an excessive swayback. WALKING Walk with an upright posture. Your ears, shoulders and hips should all line-up. PROLONGED ACTIVITY IN A FLEXED POSITION When completing a task that requires you to bend forward at your waist or lean over a low surface, try to find a way to stabilize 3 out of 4 of your limbs. You can place a hand or elbow on your thigh or rest a knee on the surface you are reaching across. This will provide you more stability so that your muscles do not fatigue as quickly. By keeping your knees relaxed, or slightly bent, you will also reduce stress across your lower back. CORRECT LIFTING TECHNIQUES DO :   Assume a wide stance. This will provide you more stability and the opportunity to get as close as possible to the object which you are lifting.  Tense your abdominals to brace your spine. Bend at the knees and hips. Keeping your back locked in a neutral-spine position, lift using your leg muscles. Lift with your legs, keeping your back straight.  Test the weight of unknown objects before attempting to lift them.  Try to keep your elbows locked down at your sides in order get the best strength from your shoulders when carrying an object.  Always ask for help when lifting heavy or awkward objects. INCORRECT LIFTING TECHNIQUES DO NOT:   Lock your knees when lifting, even if it is a small object.  Bend and twist. Pivot at your feet or move your feet when needing to change directions.  Assume that you can safely pick up even a  paper clip without proper posture.   This information is not intended to replace advice given to you by your  health care provider. Make sure you discuss any questions you have with your health care provider.   Document Released: 10/14/2005 Document Revised: 11/04/2014 Document Reviewed: 01/26/2009 [employer] Interactive Patient Education [employer].

## 2016-03-11 NOTE — ED Notes (Signed)
Pt states "I think I pulled a muscle in my back." States he lifted something heavy at work 2 weeks ago.

## 2016-07-12 ENCOUNTER — Emergency Department
Admission: EM | Admit: 2016-07-12 | Discharge: 2016-07-12 | Disposition: A | Discharge status: Home / Self Care | Payer: Self-pay | Attending: Student in an Organized Health Care Education/Training Program | Admitting: Student in an Organized Health Care Education/Training Program

## 2016-07-12 ENCOUNTER — Emergency Department: Payer: Self-pay

## 2016-07-12 ENCOUNTER — Encounter: Payer: Self-pay | Admitting: Emergency Medicine

## 2016-07-12 DIAGNOSIS — S39012A Strain of muscle, fascia and tendon of lower back, initial encounter: Secondary | ICD-10-CM | POA: Insufficient documentation

## 2016-07-12 DIAGNOSIS — Z7982 Long term (current) use of aspirin: Secondary | ICD-10-CM | POA: Insufficient documentation

## 2016-07-12 DIAGNOSIS — F17200 Nicotine dependence, unspecified, uncomplicated: Secondary | ICD-10-CM | POA: Insufficient documentation

## 2016-07-12 DIAGNOSIS — F1290 Cannabis use, unspecified, uncomplicated: Secondary | ICD-10-CM | POA: Insufficient documentation

## 2016-07-12 DIAGNOSIS — X500XXA Overexertion from strenuous movement or load, initial encounter: Secondary | ICD-10-CM | POA: Insufficient documentation

## 2016-07-12 DIAGNOSIS — Y990 Civilian activity done for income or pay: Secondary | ICD-10-CM | POA: Insufficient documentation

## 2016-07-12 DIAGNOSIS — Y9389 Activity, other specified: Secondary | ICD-10-CM | POA: Insufficient documentation

## 2016-07-12 DIAGNOSIS — T148XXA Other injury of unspecified body region, initial encounter: Secondary | ICD-10-CM

## 2016-07-12 DIAGNOSIS — Y929 Unspecified place or not applicable: Secondary | ICD-10-CM | POA: Insufficient documentation

## 2016-07-12 MED ORDER — IBUPROFEN 800 MG PO TABS
800.0000 mg | ORAL_TABLET | Freq: Once | ORAL | Status: DC
  Filled 2016-07-12: qty 1

## 2016-07-12 MED ORDER — METHOCARBAMOL 750 MG PO TABS: 750.0000 mg | ORAL_TABLET | Freq: Four times a day (QID) | ORAL | 0 refills | Status: DC

## 2016-07-12 MED ORDER — MELOXICAM 15 MG PO TABS: 15.0000 mg | ORAL_TABLET | Freq: Every day | ORAL | 0 refills | Status: DC

## 2016-07-12 MED ORDER — BUTALBITAL-APAP-CAFFEINE 50-325-40 MG PO TABS
2.0000 | ORAL_TABLET | Freq: Once | ORAL | Status: AC
  Administered 2016-07-12: 2 via ORAL
  Filled 2016-07-12: qty 2

## 2016-07-12 NOTE — ED Provider Notes (Signed)
[facility] Emergency Department Provider Note  ____________________________________________  Time seen: Approximately 11:39 AM  I have reviewed the triage vital signs and the nursing notes.   HISTORY  Chief Complaint Back Pain    HPI Vincent Stout is a 35 y.o. male presents for evaluation of low back pain for 2 weeks. Patient states he was seen before given some medication to better but now the pain is back. Scribe's pain is 10 over 10 in no numbness or tingling in the groin. He reports doing lifting a work otherwise no known specific trauma.   History reviewed. No pertinent past medical history.  There are no active problems to display for this patient.   History reviewed. No pertinent surgical history.  Prior to Admission medications   Medication Sig Start Date End Date Taking? Authorizing Provider  aspirin 325 MG tablet Take 650 mg by mouth every 6 (six) hours as needed for moderate pain or headache.    Historical Provider, MD  meloxicam (MOBIC) 15 MG tablet Take 1 tablet (15 mg total) by mouth daily. 07/12/16   Charmayne Sheer Beers, PA-C  methocarbamol (ROBAXIN) 750 MG tablet Take 1 tablet (750 mg total) by mouth 4 (four) times daily. 07/12/16   Evangeline Dakin, PA-C    Allergies Review of patient's allergies indicates no known allergies.  No family history on file.  Social History Social History  Substance Use Topics  . Smoking status: Current Some Day Smoker  . Smokeless tobacco: Not on file  . Alcohol use Yes     Comment: social    Review of Systems Constitutional: No fever/chills Cardiovascular: Denies chest pain. Respiratory: Denies shortness of breath. Gastrointestinal: No abdominal pain.  No nausea, no vomiting.  No diarrhea.  No constipation. Genitourinary: Negative for dysuria. Musculoskeletal: Positive for low back pain. Skin: Negative for rash. Neurological: Negative for headaches, focal weakness or numbness.  10-point ROS  otherwise negative.  ____________________________________________   PHYSICAL EXAM:  VITAL SIGNS: ED Triage Vitals [07/12/16 1138]  Enc Vitals Group     BP 103/63     Pulse Rate 83     Resp 18     Temp 98.6 F (37 C)     Temp Source Oral     SpO2 100 %     Weight 173 lb (78.5 kg)     Height 6' (1.829 m)     Head Circumference      Peak Flow      Pain Score 9     Pain Loc      Pain Edu?      Excl. in GC?     Constitutional: Alert and oriented. Well appearing and in no acute distress. Neck: No stridor.Supple full range of motion nontender.   Cardiovascular: Normal rate, regular rhythm. Grossly normal heart sounds.  Good peripheral circulation. Respiratory: Normal respiratory effort.  No retractions. Lungs CTAB. Musculoskeletal: Positive for low back pain straight leg raise unremarkable point tenderness noted to the lumbar paraspinal muscle area. Neurologic:  Normal speech and language. No gross focal neurologic deficits are appreciated. No gait instability. Skin:  Skin is warm, dry and intact. No rash noted. Psychiatric: Mood and affect are normal. Speech and behavior are normal.  ____________________________________________   LABS (all labs ordered are listed, but only abnormal results are displayed)  Labs Reviewed - No data to display ____________________________________________  EKG   ____________________________________________  RADIOLOGY  No acute osseous findings noted. ____________________________________________   PROCEDURES  Procedure(s) performed: None  Critical Care performed: No  ____________________________________________   INITIAL IMPRESSION / ASSESSMENT AND PLAN / ED COURSE  Pertinent labs & imaging results that were available during my care of the patient were reviewed by me and considered in my medical decision making (see chart for details). Review of the Center CSRS was performed in accordance of the [facility] prior to dispensing any  controlled drugs.  Lumbar myofascial strain. Rx given for Flexeril 10 mg 3 times a day meloxicam 15 mg daily. Patient follow-up PCP or return here with any worsening symptomology.  Clinical Course    ____________________________________________   FINAL CLINICAL IMPRESSION(S) / ED DIAGNOSES  Final diagnoses:  Muscle strain  Lumbar strain, initial encounter     This chart was dictated using voice recognition software/Dragon. Despite best efforts to proofread, errors can occur which can change the meaning. Any change was purely unintentional.    Evangeline Dakinharles M Beers, PA-C 07/12/16 1252    Willy EddyPatrick Robinson, MD 07/12/16 1444

## 2016-07-12 NOTE — ED Triage Notes (Signed)
Pt presents with low back pain for two weeks. Pt denies dysuria.

## 2016-07-22 ENCOUNTER — Emergency Department: Payer: Self-pay

## 2016-07-22 ENCOUNTER — Encounter: Payer: Self-pay | Admitting: *Deleted

## 2016-07-22 ENCOUNTER — Emergency Department
Admission: EM | Admit: 2016-07-22 | Discharge: 2016-07-22 | Disposition: A | Discharge status: Home / Self Care | Payer: Self-pay | Attending: Emergency Medicine | Admitting: Emergency Medicine

## 2016-07-22 DIAGNOSIS — M549 Dorsalgia, unspecified: Secondary | ICD-10-CM

## 2016-07-22 DIAGNOSIS — Y929 Unspecified place or not applicable: Secondary | ICD-10-CM | POA: Insufficient documentation

## 2016-07-22 DIAGNOSIS — R1030 Lower abdominal pain, unspecified: Secondary | ICD-10-CM | POA: Insufficient documentation

## 2016-07-22 DIAGNOSIS — Z87891 Personal history of nicotine dependence: Secondary | ICD-10-CM | POA: Insufficient documentation

## 2016-07-22 DIAGNOSIS — Z791 Long term (current) use of non-steroidal anti-inflammatories (NSAID): Secondary | ICD-10-CM | POA: Insufficient documentation

## 2016-07-22 DIAGNOSIS — Y999 Unspecified external cause status: Secondary | ICD-10-CM | POA: Insufficient documentation

## 2016-07-22 DIAGNOSIS — M545 Low back pain, unspecified: Secondary | ICD-10-CM

## 2016-07-22 DIAGNOSIS — Y9389 Activity, other specified: Secondary | ICD-10-CM | POA: Insufficient documentation

## 2016-07-22 DIAGNOSIS — X500XXA Overexertion from strenuous movement or load, initial encounter: Secondary | ICD-10-CM | POA: Insufficient documentation

## 2016-07-22 DIAGNOSIS — Z7982 Long term (current) use of aspirin: Secondary | ICD-10-CM | POA: Insufficient documentation

## 2016-07-22 LAB — URINALYSIS COMPLETE WITH MICROSCOPIC (ARMC ONLY)
BILIRUBIN URINE: NEGATIVE
Bacteria, UA: NONE SEEN
GLUCOSE, UA: NEGATIVE mg/dL
HGB URINE DIPSTICK: NEGATIVE
KETONES UR: NEGATIVE mg/dL
LEUKOCYTES UA: NEGATIVE
NITRITE: NEGATIVE
PH: 7 (ref 5.0–8.0)
Protein, ur: NEGATIVE mg/dL
Specific Gravity, Urine: 1.011 (ref 1.005–1.030)
Squamous Epithelial / LPF: NONE SEEN

## 2016-07-22 LAB — CBC
HCT: 42.7 % (ref 40.0–52.0)
Hemoglobin: 15.1 g/dL (ref 13.0–18.0)
MCH: 31 pg (ref 26.0–34.0)
MCHC: 35.5 g/dL (ref 32.0–36.0)
MCV: 87.4 fL (ref 80.0–100.0)
PLATELETS: 176 10*3/uL (ref 150–440)
RBC: 4.88 MIL/uL (ref 4.40–5.90)
RDW: 13.4 % (ref 11.5–14.5)
WBC: 10.1 10*3/uL (ref 3.8–10.6)

## 2016-07-22 LAB — COMPREHENSIVE METABOLIC PANEL
ALK PHOS: 39 U/L (ref 38–126)
ALT: 14 U/L — AB (ref 17–63)
AST: 18 U/L (ref 15–41)
Albumin: 4.4 g/dL (ref 3.5–5.0)
Anion gap: 6 (ref 5–15)
BILIRUBIN TOTAL: 0.8 mg/dL (ref 0.3–1.2)
BUN: 9 mg/dL (ref 6–20)
CALCIUM: 9.2 mg/dL (ref 8.9–10.3)
CHLORIDE: 104 mmol/L (ref 101–111)
CO2: 28 mmol/L (ref 22–32)
CREATININE: 1.08 mg/dL (ref 0.61–1.24)
Glucose, Bld: 88 mg/dL (ref 65–99)
Potassium: 3.3 mmol/L — ABNORMAL LOW (ref 3.5–5.1)
Sodium: 138 mmol/L (ref 135–145)
TOTAL PROTEIN: 7.3 g/dL (ref 6.5–8.1)

## 2016-07-22 MED ORDER — TRAMADOL HCL 50 MG PO TABS: 50.0000 mg | ORAL_TABLET | Freq: Four times a day (QID) | ORAL | 0 refills | Status: DC | PRN

## 2016-07-22 MED ORDER — OXYCODONE-ACETAMINOPHEN 5-325 MG PO TABS
1.0000 | ORAL_TABLET | Freq: Once | ORAL | Status: AC
  Administered 2016-07-22: 1 via ORAL
  Filled 2016-07-22: qty 1

## 2016-07-22 NOTE — ED Provider Notes (Addendum)
[facility] Emergency Department Provider Note  ____________________________________________   I have reviewed the triage vital signs and the nursing notes.   HISTORY  Chief Complaint Back Pain and Groin Pain    HPI Vincent Stout is a 35 y.o. male who is healthy at baseline. He has had back pain off and on since may when he injured it lifting.  In may, he had negative x rays. There was no traumatic event, just gradual increase in pain, as he describes it. It is on the left.  He had negative x rays, had pain meds, and the pain went away until it came back about a month ago. No fever no chills no radiation of sx no n/v no numbness no weakness no incontinence of bowel or bladder. Worse with motion. Pain is sharp and achy on the left lower back towards the flank with radiation now towards his testicles.  He says he is not currently working and has not been doing more heavy lifting. He was seen here 10 days ago, had repeat negative x rays and was sent home with muscle relaxers which did not improve his status. He presents today with continuing discomfort.      History reviewed. No pertinent past medical history.  There are no active problems to display for this patient.   History reviewed. No pertinent surgical history.  Prior to Admission medications   Medication Sig Start Date End Date Taking? Authorizing Provider  aspirin 325 MG tablet Take 650 mg by mouth every 6 (six) hours as needed for moderate pain or headache.    Historical Provider, MD  meloxicam (MOBIC) 15 MG tablet Take 1 tablet (15 mg total) by mouth daily. 07/12/16   Charmayne Sheer Beers, PA-C  methocarbamol (ROBAXIN) 750 MG tablet Take 1 tablet (750 mg total) by mouth 4 (four) times daily. 07/12/16   Evangeline Dakin, PA-C    Allergies Review of patient's allergies indicates no known allergies.  No family history on file.  Social History Social History  Substance Use Topics  . Smoking status: Former  Games developer  . Smokeless tobacco: Never Used  . Alcohol use Yes     Comment: social    Review of Systems Constitutional: No fever/chills Eyes: No visual changes. ENT: No sore throat. No stiff neck no neck pain Cardiovascular: Denies chest pain. Respiratory: Denies shortness of breath. Gastrointestinal:   no vomiting.  No diarrhea.  No constipation. Genitourinary: Negative for dysuria. Musculoskeletal: Negative lower extremity swelling Skin: Negative for rash. Neurological: Negative for severe headaches, focal weakness or numbness. 10-point ROS otherwise negative.  ____________________________________________   PHYSICAL EXAM:  VITAL SIGNS: ED Triage Vitals [07/22/16 1314]  Enc Vitals Group     BP 113/70     Pulse Rate 74     Resp 18     Temp 98.3 F (36.8 C)     Temp Source Oral     SpO2 96 %     Weight 173 lb (78.5 kg)     Height 6' (1.829 m)     Head Circumference      Peak Flow      Pain Score 9     Pain Loc      Pain Edu?      Excl. in GC?     Constitutional: Alert and oriented. Well appearing and in no acute distress. Eyes: Conjunctivae are normal. PERRL. EOMI. Head: Atraumatic. Nose: No congestion/rhinnorhea. Mouth/Throat: Mucous membranes are moist.  Oropharynx non-erythematous. Neck: No stridor.  Nontender with no meningismus Cardiovascular: Normal rate, regular rhythm. Grossly normal heart sounds.  Good peripheral circulation. Respiratory: Normal respiratory effort.  No retractions. Lungs CTAB. Abdominal: Soft and nontender. No distention. No guarding no rebound Back: + L paraspinal tenderness in the lumbar region that radiates to the flank  there is no midline tenderness there are no lesions noted. there is + left CVA tenderness GU: normal external male genitalia no testicular mass or tenderness no discharge.   Musculoskeletal: No lower extremity tenderness, no upper extremity tenderness. No joint effusions, no DVT signs strong distal pulses no  edema Neurologic:  Normal speech and language. No gross focal neurologic deficits are appreciated. + saddle anesthesia. Skin:  Skin is warm, dry and intact. No rash noted. Psychiatric: Mood and affect are normal. Speech and behavior are normal.  ____________________________________________   LABS (all labs ordered are listed, but only abnormal results are displayed)  Labs Reviewed  COMPREHENSIVE METABOLIC PANEL - Abnormal; Notable for the following:       Result Value   Potassium 3.3 (*)    ALT 14 (*)    All other components within normal limits  URINALYSIS COMPLETEWITH MICROSCOPIC ([facility] ONLY) - Abnormal; Notable for the following:    Color, Urine YELLOW (*)    APPearance CLEAR (*)    All other components within normal limits  CBC   ____________________________________________  EKG  I personally interpreted any EKGs ordered by me or triage  ____________________________________________  RADIOLOGY  I reviewed any imaging ordered by me or triage that were performed during my shift and, if possible, patient and/or family made aware of any abnormal findings. ____________________________________________   PROCEDURES  Procedure(s) performed: None  Procedures  Critical Care performed: None  ____________________________________________   INITIAL IMPRESSION / ASSESSMENT AND PLAN / ED COURSE  Pertinent labs & imaging results that were available during my care of the patient were reviewed by me and considered in my medical decision making (see chart for details).  Pt with back pain x nearly 5 months off and on. At this time, his w/u is reassuring and he likely has a msk cause of his pain, there is, however, some evidence of flank pain and he c/o tenderness radiating to his groin.  He may have undx kidney stone pathology and, given multiple returns to emergency medical cair (in part because of no outpt care, granted) we will obtain ct to r/u obstructing stone. Nothing to  suggest pe, dissection, acs, aaa or cauda equina sx.   ----------------------------------------- 4:06 PM on 07/22/2016 -----------------------------------------  The son, there does not appear to be any primary pathology noted in his testicles. Patient does state that the pain from his lower back radiates there. I do not elicit with palpation noted I feel any masses. However I have offered him an ultrasound of that area to ensure that we are not missing any pathology. Patient refuses. He states he has to leave. He understands that he can come back at any time if he feels worse. Extensive return precautions and follow-up given and understood that if he has any increased pain or swelling anywhere in his body or has any worsening symptoms, a changes mind about further evaluation he understands that he is to come back.  Clinical Course   ____________________________________________   FINAL CLINICAL IMPRESSION(S) / ED DIAGNOSES  Final diagnoses:  Back pain      This chart was dictated using voice recognition software.  Despite best efforts to proofread,  errors can occur which  can change meaning.      Jeanmarie PlantJames A McShane, MD 07/22/16 1451    Jeanmarie PlantJames A McShane, MD 07/22/16 [phone]

## 2016-07-22 NOTE — ED Triage Notes (Signed)
Patient states the left side lumbar pain that has spread to the right. Patient has also c/o groin pain that recently began. Patient denies blood in the urine or penile discharge.

## 2016-07-23 LAB — CHLAMYDIA/NGC RT PCR (ARMC ONLY)
CHLAMYDIA TR: NOT DETECTED
N gonorrhoeae: NOT DETECTED

## 2016-07-29 ENCOUNTER — Ambulatory Visit
Admission: RE | Admit: 2016-07-29 | Discharge: 2016-07-29 | Disposition: A | Discharge status: Home / Self Care | Payer: Self-pay | Source: Ambulatory Visit | Attending: Chiropractor | Admitting: Chiropractor

## 2016-07-29 ENCOUNTER — Other Ambulatory Visit: Payer: Self-pay | Admitting: Chiropractor

## 2016-07-29 DIAGNOSIS — R52 Pain, unspecified: Secondary | ICD-10-CM | POA: Insufficient documentation

## 2016-07-29 DIAGNOSIS — M546 Pain in thoracic spine: Secondary | ICD-10-CM

## 2016-07-29 DIAGNOSIS — M545 Low back pain: Secondary | ICD-10-CM

## 2017-03-19 ENCOUNTER — Emergency Department: Payer: Self-pay

## 2017-03-19 ENCOUNTER — Encounter: Payer: Self-pay | Admitting: Emergency Medicine

## 2017-03-19 ENCOUNTER — Emergency Department
Admission: EM | Admit: 2017-03-19 | Discharge: 2017-03-19 | Disposition: A | Discharge status: Home / Self Care | Payer: Self-pay | Attending: Emergency Medicine | Admitting: Emergency Medicine

## 2017-03-19 DIAGNOSIS — B353 Tinea pedis: Secondary | ICD-10-CM | POA: Insufficient documentation

## 2017-03-19 DIAGNOSIS — M546 Pain in thoracic spine: Secondary | ICD-10-CM | POA: Insufficient documentation

## 2017-03-19 DIAGNOSIS — M5412 Radiculopathy, cervical region: Secondary | ICD-10-CM | POA: Insufficient documentation

## 2017-03-19 DIAGNOSIS — G8929 Other chronic pain: Secondary | ICD-10-CM

## 2017-03-19 DIAGNOSIS — M5410 Radiculopathy, site unspecified: Secondary | ICD-10-CM

## 2017-03-19 DIAGNOSIS — Z87891 Personal history of nicotine dependence: Secondary | ICD-10-CM | POA: Insufficient documentation

## 2017-03-19 LAB — URINALYSIS, COMPLETE (UACMP) WITH MICROSCOPIC
Bacteria, UA: NONE SEEN
Bilirubin Urine: NEGATIVE
GLUCOSE, UA: NEGATIVE mg/dL
HGB URINE DIPSTICK: NEGATIVE
Ketones, ur: NEGATIVE mg/dL
Leukocytes, UA: NEGATIVE
NITRITE: NEGATIVE
PH: 7 (ref 5.0–8.0)
PROTEIN: NEGATIVE mg/dL
Specific Gravity, Urine: 1.019 (ref 1.005–1.030)
Squamous Epithelial / LPF: NONE SEEN

## 2017-03-19 MED ORDER — CLOTRIMAZOLE 1 % EX CREA: 1.0000 "application " | TOPICAL_CREAM | Freq: Two times a day (BID) | CUTANEOUS | 0 refills | Status: DC

## 2017-03-19 MED ORDER — TRAMADOL HCL 50 MG PO TABS: 50.0000 mg | ORAL_TABLET | Freq: Four times a day (QID) | ORAL | 0 refills | Status: AC | PRN

## 2017-03-19 MED ORDER — KETOROLAC TROMETHAMINE 30 MG/ML IJ SOLN
30.0000 mg | Freq: Once | INTRAMUSCULAR | Status: AC
  Administered 2017-03-19: 30 mg via INTRAMUSCULAR
  Filled 2017-03-19: qty 1

## 2017-03-19 NOTE — ED Triage Notes (Signed)
Pt in via POV with complaints of upper back pain x two days, denies any recent injury.  Pt ambulatory to triage without difficulty.  NAD noted at this time.

## 2017-03-19 NOTE — ED Notes (Signed)
A/o. Able to ambulate. Multiple spine complaints. States that he has had instance of incontinence of urine and numbness and tingling on right side when lying on stomach.

## 2017-03-19 NOTE — ED Provider Notes (Signed)
[facility] Emergency Department Provider Note ____________________________________________  Time seen: 9:27 AM  I have reviewed the triage vital signs and the nursing notes.  HISTORY  Chief Complaint  Back Pain   HPI Vincent Stout is a 36 y.o. male history of multiple complaints. The patient complains of skin discoloration between his toes on his right foot which is been going on for some time. He also is here with complaint of upper back pain for 2 days. He denies any recent injury. He has not taken any over-the-counter medication today for his pain. He also states that when lying on his stomach he is experienced numbness on the entire right hand side of his body. He states this has been going on for "long time". He has not seen any orthopedist for his problems. He denies any fever or chills. He denies any recent injuries. He states that he is not have these sensations when he does not lie on his stomach. Currently patient rates his pain as 7 out of 10. Patient also complained of urinary frequency.    History reviewed. No pertinent past medical history.  There are no active problems to display for this patient.   History reviewed. No pertinent surgical history.  Prior to Admission medications   Medication Sig Start Date End Date Taking? Authorizing Provider  aspirin 325 MG tablet Take 650 mg by mouth every 6 (six) hours as needed for moderate pain or headache.    [provider]  clotrimazole (LOTRIMIN) 1 % cream Apply 1 application topically 2 (two) times daily. 03/19/17   Tommi RumpsSummers, Rhonda L, PA-C  traMADol (ULTRAM) 50 MG tablet Take 1 tablet (50 mg total) by mouth every 6 (six) hours as needed. 03/19/17 03/19/18  Tommi RumpsSummers, Rhonda L, PA-C    Allergies Patient has no known allergies.  No family history on file.  Social History Social History  Substance Use Topics  . Smoking status: Former Games developermoker  . Smokeless tobacco: Never Used  . Alcohol use Yes      Comment: social    Review of Systems  Constitutional: Negative for fever. Eyes: Negative for visual changes. ENT: Negative for sore throat. Cardiovascular: Negative for chest pain. Respiratory: Negative for shortness of breath. Gastrointestinal: Negative for abdominal pain, vomiting and diarrhea. Genitourinary: Negative for dysuria. Musculoskeletal: Positive for left upper back pain. Positive for chronic back pain. Skin: Positive for rash between toes on right foot. Neurological: Negative for headaches, focal weakness.  Positive for right upper and lower paresthesias only when lying on his stomach. ____________________________________________  PHYSICAL EXAM:  VITAL SIGNS: ED Triage Vitals  Enc Vitals Group     BP 03/19/17 0917 131/78     Pulse Rate 03/19/17 0917 67     Resp 03/19/17 0917 18     Temp 03/19/17 0917 98.7 F (37.1 C)     Temp Source 03/19/17 0917 Oral     SpO2 03/19/17 0917 99 %     Weight 03/19/17 0918 170 lb (77.1 kg)     Height 03/19/17 0918 6' (1.829 m)     Head Circumference --      Peak Flow --      Pain Score 03/19/17 0917 7     Pain Loc --      Pain Edu? --      Excl. in GC? --     Constitutional: Alert and oriented. Well appearing and in no distress. Head: Normocephalic and atraumatic. Eyes: Conjunctivae are normal.  Neck: Supple.  Hematological/Lymphatic/Immunological: No cervical lymphadenopathy. Cardiovascular: Normal rate, regular rhythm. Normal distal pulses. Respiratory: Normal respiratory effort. No wheezes/rales/rhonchi. Musculoskeletal: Examination of the left thoracic and lumbar spine is nontender to palpation. There is moderate tenderness on palpation of the parascapular muscles. No gross deformity is noted. Range of motion is without restriction. No crepitus was noted. On examination of cervical spine there is no gross deformity and minimal tenderness on palpation posteriorly. Range of motion is within normal limits in all planes  with minimal discomfort. Patient moves lower extremities without any difficulty and normal gait was noted. Neurologic:  Normal gait without ataxia. Normal speech and language. No gross focal neurologic deficits are appreciated. Skin:  Skin is warm, dry and intact. No ecchymosis, abrasions or erythema was noted. Right foot web spaces were moist, pale, with fungal appearance. Psychiatric: Mood and affect are normal. Patient exhibits appropriate insight and judgment. ____________________________________________   RADIOLOGY Cervical spine x-ray per radiologist was negative. I, Tommi Rumps, personally viewed and evaluated these images (plain radiographs) as part of my medical decision making, as well as reviewing the written report by the radiologist. ____________________________________________   INITIAL IMPRESSION / ASSESSMENT AND PLAN / ED COURSE Patient was encouraged to obtain a PCP and was given a listing of multiple clinics in the area that either a friend or on a sliding scale fee. He is encouraged to call these and set up an appointment. Patient was given a prescription for Lotrimin cream to apply to right foot twice a day for fungal infection. He is also told to make sure that his foot was completely dry and also with a white socks at this time. Patient was given a prescription for tramadol 1 every 6 hours as needed for pain. He may continue taking ibuprofen or Tylenol as needed. Patient was also told to discontinue sleeping on his stomach.    ____________________________________________  FINAL CLINICAL IMPRESSION(S) / ED DIAGNOSES  Final diagnoses:  Chronic left-sided thoracic back pain  Radiculopathy affecting upper extremity  Tinea pedis, right     Tommi Rumps, PA-C 03/19/17 1619    Sharman Cheek, MD 03/20/17 [phone]

## 2017-03-19 NOTE — Discharge Instructions (Signed)
Call clinics listed above for an appointment.  Contact information is listed for [facility], [facility], [facility], and [facility]. He will need to establish a primary care provider. Take tramadol as needed for pain 1 every 6 hours. He may also take Tylenol with this medication. Also begin using the cream in between your toes. After showering or taking a bath make sure that this skin between your toes is completely dry before applying the cream. Also we wear white socks. Allow  foot to get as much air as possible.

## 2018-01-12 ENCOUNTER — Emergency Department (HOSPITAL_COMMUNITY)
Admission: EM | Admit: 2018-01-12 | Discharge: 2018-01-12 | Disposition: A | Discharge status: Home / Self Care | Payer: Self-pay | Attending: Emergency Medicine | Admitting: Emergency Medicine

## 2018-01-12 ENCOUNTER — Emergency Department (HOSPITAL_COMMUNITY): Payer: Self-pay

## 2018-01-12 ENCOUNTER — Encounter (HOSPITAL_COMMUNITY): Payer: Self-pay | Admitting: *Deleted

## 2018-01-12 ENCOUNTER — Other Ambulatory Visit: Payer: Self-pay

## 2018-01-12 DIAGNOSIS — Z79899 Other long term (current) drug therapy: Secondary | ICD-10-CM | POA: Insufficient documentation

## 2018-01-12 DIAGNOSIS — Z7982 Long term (current) use of aspirin: Secondary | ICD-10-CM | POA: Insufficient documentation

## 2018-01-12 DIAGNOSIS — R69 Illness, unspecified: Secondary | ICD-10-CM

## 2018-01-12 DIAGNOSIS — J111 Influenza due to unidentified influenza virus with other respiratory manifestations: Secondary | ICD-10-CM | POA: Insufficient documentation

## 2018-01-12 DIAGNOSIS — J069 Acute upper respiratory infection, unspecified: Secondary | ICD-10-CM

## 2018-01-12 MED ORDER — BENZONATATE 100 MG PO CAPS: 200.0000 mg | ORAL_CAPSULE | Freq: Three times a day (TID) | ORAL | 0 refills | Status: DC

## 2018-01-12 MED ORDER — KETOROLAC TROMETHAMINE 30 MG/ML IJ SOLN
30.0000 mg | Freq: Once | INTRAMUSCULAR | Status: AC
  Administered 2018-01-12: 30 mg via INTRAMUSCULAR
  Filled 2018-01-12: qty 1

## 2018-01-12 MED ORDER — FLUTICASONE PROPIONATE 50 MCG/ACT NA SUSP: 1.0000 | Freq: Every day | NASAL | 0 refills | Status: DC

## 2018-01-12 MED ORDER — NAPROXEN 500 MG PO TABS: 500.0000 mg | ORAL_TABLET | Freq: Two times a day (BID) | ORAL | 0 refills | Status: DC

## 2018-01-12 NOTE — ED Triage Notes (Signed)
Pt states headache, chest pain, back pain, chest congestion.  Pain increases with inspiration.

## 2018-01-12 NOTE — ED Provider Notes (Signed)
[facility] EMERGENCY DEPARTMENT Provider Note   CSN: [id number] Arrival date & time: 01/12/18  6578     History   Chief Complaint Chief Complaint  Patient presents with  . URI    HPI Vincent Stout is a 37 y.o. male past medical history, who presents to ED for evaluation of 3-day history of cough productive with yellow/green mucus, chest discomfort worse with coughing, generalized body aches, subjective fever.  States this feels similar to the time that he had walking pneumonia.  He has been taking over-the-counter Mucinex and Tylenol Cold and sinus with mild to no improvement in his symptoms.  He did not receive his influenza vaccine this year.  He denies any shortness of breath, hemoptysis, wheezing, recent surgeries, recent prolonged travel, hormone use, history of cancer, prior MI, DVT or PE.  HPI  History reviewed. No pertinent past medical history.  There are no active problems to display for this patient.   History reviewed. No pertinent surgical history.     Home Medications    Prior to Admission medications   Medication Sig Start Date End Date Taking? Authorizing Provider  aspirin 325 MG tablet Take 650 mg by mouth every 6 (six) hours as needed for moderate pain or headache.    [provider]  benzonatate (TESSALON) 100 MG capsule Take 2 capsules (200 mg total) by mouth every 8 (eight) hours. 01/12/18   Palmyra Rogacki, PA-C  clotrimazole (LOTRIMIN) 1 % cream Apply 1 application topically 2 (two) times daily. 03/19/17   Tommi Rumps, PA-C  fluticasone (FLONASE) 50 MCG/ACT nasal spray Place 1 spray into both nostrils daily. 01/12/18   Montgomery Favor, PA-C  naproxen (NAPROSYN) 500 MG tablet Take 1 tablet (500 mg total) by mouth 2 (two) times daily. 01/12/18   Ayodele Sangalang, PA-C  traMADol (ULTRAM) 50 MG tablet Take 1 tablet (50 mg total) by mouth every 6 (six) hours as needed. 03/19/17 03/19/18  Tommi Rumps, PA-C    Family History No  family history on file.  Social History Social History   Tobacco Use  . Smoking status: Former Games developer  . Smokeless tobacco: Never Used  Substance Use Topics  . Alcohol use: Yes    Comment: social  . Drug use: Yes    Types: Marijuana    Comment: daily     Allergies   Patient has no known allergies.   Review of Systems Review of Systems  Constitutional: Positive for chills. Negative for fever.  HENT: Positive for congestion and rhinorrhea. Negative for ear pain, mouth sores, sinus pressure, sinus pain and sore throat.   Respiratory: Positive for cough. Negative for choking, shortness of breath and stridor.   Cardiovascular: Positive for chest pain.  Gastrointestinal: Positive for nausea. Negative for anal bleeding, blood in stool, constipation and vomiting.  Musculoskeletal: Negative for neck pain and neck stiffness.  Neurological: Negative for weakness and numbness.     Physical Exam Updated Vital Signs BP (!) 135/95 (BP Location: Right Arm)   Pulse 68   Temp 98.6 F (37 C) (Oral)   Resp 17   Ht 6' (1.829 m)   Wt 80.3 kg (177 lb)   SpO2 97%   BMI 24.01 kg/m   Physical Exam  Constitutional: He appears well-developed and well-nourished. No distress.  Nontoxic appearing and in no acute distress.  Speaking in complete sentences without difficulty.  HENT:  Head: Normocephalic and atraumatic.  Right Ear: A middle ear effusion is present.  Left  Ear: A middle ear effusion is present.  Nose: Rhinorrhea present.  Mouth/Throat: Uvula is midline. No trismus in the jaw. No uvula swelling. No tonsillar exudate.  Eyes: Conjunctivae and EOM are normal. No scleral icterus.  Neck: Normal range of motion.  Cardiovascular: Normal rate, regular rhythm and normal heart sounds.  Pulmonary/Chest: Effort normal and breath sounds normal. No respiratory distress. He has no wheezes. He exhibits tenderness.    Musculoskeletal: He exhibits no edema or tenderness.  No lower extremity  edema, erythema or calf tenderness noted bilaterally.  Neurological: He is alert.  Skin: No rash noted. He is not diaphoretic.  Psychiatric: He has a normal mood and affect.  Nursing note and vitals reviewed.    ED Treatments / Results  Labs (all labs ordered are listed, but only abnormal results are displayed) Labs Reviewed - No data to display  EKG  EKG Interpretation  Date/Time:  Monday January 12 2018 09:11:50 EDT Ventricular Rate:  77 PR Interval:  160 QRS Duration: 92 QT Interval:  372 QTC Calculation: 420 R Axis:   -47 Text Interpretation:  Normal sinus rhythm Left axis deviation Incomplete right bundle branch block Abnormal ECG No significant change was found Confirmed by Azalia Bilis ([id number]) on 01/12/2018 10:15:44 AM       Radiology Dg Chest 2 View  Result Date: 01/12/2018 CLINICAL DATA:  Shortness of breath and chest pain EXAM: CHEST - 2 VIEW COMPARISON:  September 30, 2014 FINDINGS: Lungs are clear. Heart size and pulmonary vascularity are normal. No adenopathy. No pneumothorax. No bone lesions. IMPRESSION: No edema or consolidation. Electronically Signed   By: Bretta Bang III M.D.   On: 01/12/2018 09:55    Procedures Procedures (including critical care time)  Medications Ordered in ED Medications  ketorolac (TORADOL) 30 MG/ML injection 30 mg (not administered)     Initial Impression / Assessment and Plan / ED Course  I have reviewed the triage vital signs and the nursing notes.  Pertinent labs & imaging results that were available during my care of the patient were reviewed by me and considered in my medical decision making (see chart for details).     Patient presents to ED for evaluation of URI symptoms including 3-day history of cough productive with yellow/green mucus, chest discomfort with coughing, generalized body aches and subjective fever.  States this feels similar to the time that he had walking pneumonia.  Denies any wheezing, trouble  breathing, hemoptysis, recent surgeries, recent prolonged travel, leg swelling.  Did not receive his influenza vaccine this year.  On physical exam patient is overall well-appearing.  He does have musculoskeletal tenderness to palpation in the indicated areas.  He is afebrile with no use of antipyretics recently.  Lungs are clear to auscultation bilaterally with no difficulty breathing or respiratory distress noted.  There is no wheezing present.  He is satting at 97-98% on room air without difficulty and is not tachycardic or tachypneic.  He does not appear septic.  EKG with no significant changes from previous.  Chest x-ray with no signs of pneumonia or other acute cause.  Patient is a low risk by heart score for adverse cardiac event and he is PERC and Wells criteria negative.  I suspect that his symptoms are viral in nature.  Will give anti-inflammatories, antitussives, nasal spray and advised patient to continue over-the-counter medications as needed.  I do not believe that there is necessity for antibiotics at this time.  Patient appears stable for discharge at this  time.  Strict return precautions given.  Final Clinical Impressions(s) / ED Diagnoses   Final diagnoses:  Viral upper respiratory tract infection  Influenza-like illness    ED Discharge Orders        Ordered    naproxen (NAPROSYN) 500 MG tablet  2 times daily     01/12/18 1021    benzonatate (TESSALON) 100 MG capsule  Every 8 hours     01/12/18 1021    fluticasone (FLONASE) 50 MCG/ACT nasal spray  Daily     01/12/18 1021       Dietrich PatesKhatri, Chianna Spirito, PA-C 01/12/18 1032    Azalia Bilisampos, Kevin, MD 01/12/18 1540

## 2018-01-12 NOTE — ED Notes (Signed)
Feeling achy all over back pain, chest pain when he coughs x 3 das , yellow and green sputum

## 2018-08-21 ENCOUNTER — Emergency Department (HOSPITAL_COMMUNITY)
Admission: EM | Admit: 2018-08-21 | Discharge: 2018-08-21 | Disposition: A | Discharge status: Home / Self Care | Payer: Self-pay | Attending: Emergency Medicine | Admitting: Emergency Medicine

## 2018-08-21 ENCOUNTER — Other Ambulatory Visit: Payer: Self-pay

## 2018-08-21 ENCOUNTER — Emergency Department (HOSPITAL_COMMUNITY): Payer: Self-pay

## 2018-08-21 DIAGNOSIS — R0602 Shortness of breath: Secondary | ICD-10-CM | POA: Insufficient documentation

## 2018-08-21 DIAGNOSIS — Z79899 Other long term (current) drug therapy: Secondary | ICD-10-CM | POA: Insufficient documentation

## 2018-08-21 DIAGNOSIS — R079 Chest pain, unspecified: Secondary | ICD-10-CM | POA: Insufficient documentation

## 2018-08-21 DIAGNOSIS — Z87891 Personal history of nicotine dependence: Secondary | ICD-10-CM | POA: Insufficient documentation

## 2018-08-21 DIAGNOSIS — R42 Dizziness and giddiness: Secondary | ICD-10-CM | POA: Insufficient documentation

## 2018-08-21 DIAGNOSIS — R55 Syncope and collapse: Secondary | ICD-10-CM | POA: Insufficient documentation

## 2018-08-21 DIAGNOSIS — M62830 Muscle spasm of back: Secondary | ICD-10-CM | POA: Insufficient documentation

## 2018-08-21 LAB — CBC
HCT: 44 % (ref 39.0–52.0)
HEMOGLOBIN: 14.7 g/dL (ref 13.0–17.0)
MCH: 29.9 pg (ref 26.0–34.0)
MCHC: 33.4 g/dL (ref 30.0–36.0)
MCV: 89.6 fL (ref 80.0–100.0)
NRBC: 0 % (ref 0.0–0.2)
Platelets: 169 10*3/uL (ref 150–400)
RBC: 4.91 MIL/uL (ref 4.22–5.81)
RDW: 12.7 % (ref 11.5–15.5)
WBC: 6.5 10*3/uL (ref 4.0–10.5)

## 2018-08-21 LAB — I-STAT TROPONIN, ED: Troponin i, poc: 0 ng/mL (ref 0.00–0.08)

## 2018-08-21 LAB — BASIC METABOLIC PANEL
ANION GAP: 7 (ref 5–15)
BUN: 10 mg/dL (ref 6–20)
CO2: 26 mmol/L (ref 22–32)
Calcium: 8.8 mg/dL — ABNORMAL LOW (ref 8.9–10.3)
Chloride: 105 mmol/L (ref 98–111)
Creatinine, Ser: 0.94 mg/dL (ref 0.61–1.24)
GFR calc Af Amer: >60 mL/min (ref >60)
Glucose, Bld: 95 mg/dL (ref 70–99)
POTASSIUM: 3.6 mmol/L (ref 3.5–5.1)
SODIUM: 138 mmol/L (ref 135–145)

## 2018-08-21 LAB — CBG MONITORING, ED: GLUCOSE-CAPILLARY: 91 mg/dL (ref 70–99)

## 2018-08-21 LAB — D-DIMER, QUANTITATIVE: D-Dimer, Quant: 0.29 ug/mL-FEU (ref 0.00–0.50)

## 2018-08-21 MED ORDER — CYCLOBENZAPRINE HCL 10 MG PO TABS: 10.0000 mg | ORAL_TABLET | Freq: Two times a day (BID) | ORAL | 0 refills | Status: DC | PRN

## 2018-08-21 MED ORDER — KETOROLAC TROMETHAMINE 30 MG/ML IJ SOLN
30.0000 mg | Freq: Once | INTRAMUSCULAR | Status: AC
  Administered 2018-08-21: 30 mg via INTRAVENOUS
  Filled 2018-08-21: qty 1

## 2018-08-21 MED ORDER — IBUPROFEN 800 MG PO TABS: 800.0000 mg | ORAL_TABLET | Freq: Three times a day (TID) | ORAL | 0 refills | Status: DC | PRN

## 2018-08-21 MED ORDER — SODIUM CHLORIDE 0.9 % IV BOLUS (SEPSIS)
1000.0000 mL | Freq: Once | INTRAVENOUS | Status: AC
  Administered 2018-08-21: 1000 mL via INTRAVENOUS
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
## 2018-08-21 NOTE — ED Triage Notes (Signed)
Pt to ED via [facility] for evaluation of syncopal events. 3 days ago pt developed lightheadedness and back pain, progressing to full syncopal episodes yesterday. Pt had 5 syncopal episodes yesterday. One near syncopal episode observed by EMS. Pt also c/o chest pain associated with the back pain but denies at this time.

## 2018-08-21 NOTE — Discharge Instructions (Addendum)
To find a primary care or specialty doctor please call [phone] or [phone] to access "[facility] Find a Doctor Service." ° °You may also go on the [facility] website at [email or web address] ° °There are also multiple [facility], [facility], [facility] and [facility] practices throughout the [place] that are frequently accepting new patients. You may find a clinic that is close to your home and contact them. ° °[facility] -  °[address] °[place] [address] °[phone] ° ° °[facility] -  °[address] °[place] [address] [phone] ° ° °[facility] - °[address]  °[place] [address] °[phone] ° ° °

## 2018-12-02 ENCOUNTER — Emergency Department (HOSPITAL_COMMUNITY): Payer: Self-pay

## 2018-12-02 ENCOUNTER — Emergency Department (HOSPITAL_COMMUNITY)
Admission: EM | Admit: 2018-12-02 | Discharge: 2018-12-02 | Disposition: A | Discharge status: Home / Self Care | Payer: Self-pay | Attending: Emergency Medicine | Admitting: Emergency Medicine

## 2018-12-02 ENCOUNTER — Encounter (HOSPITAL_COMMUNITY): Payer: Self-pay | Admitting: Emergency Medicine

## 2018-12-02 DIAGNOSIS — R69 Illness, unspecified: Secondary | ICD-10-CM

## 2018-12-02 DIAGNOSIS — Z87891 Personal history of nicotine dependence: Secondary | ICD-10-CM | POA: Insufficient documentation

## 2018-12-02 DIAGNOSIS — J111 Influenza due to unidentified influenza virus with other respiratory manifestations: Secondary | ICD-10-CM | POA: Insufficient documentation

## 2018-12-02 DIAGNOSIS — Z79899 Other long term (current) drug therapy: Secondary | ICD-10-CM | POA: Insufficient documentation

## 2018-12-02 MED ORDER — IBUPROFEN 400 MG PO TABS
600.0000 mg | ORAL_TABLET | Freq: Once | ORAL | Status: AC
  Administered 2018-12-02: 600 mg via ORAL
  Filled 2018-12-02: qty 1

## 2018-12-02 MED ORDER — BENZONATATE 100 MG PO CAPS: 100.0000 mg | ORAL_CAPSULE | Freq: Three times a day (TID) | ORAL | 0 refills | Status: DC

## 2018-12-02 MED ORDER — OSELTAMIVIR PHOSPHATE 75 MG PO CAPS: 75.0000 mg | ORAL_CAPSULE | Freq: Two times a day (BID) | ORAL | 0 refills | Status: DC

## 2018-12-02 MED ORDER — NAPROXEN 500 MG PO TABS: 500.0000 mg | ORAL_TABLET | Freq: Two times a day (BID) | ORAL | 0 refills | Status: AC | PRN

## 2018-12-02 NOTE — ED Notes (Signed)
Pt returned from xray

## 2018-12-02 NOTE — ED Triage Notes (Signed)
Patient c/o generalized body aches, fatigue, N/V, fevers/chills - took OTC meds with relief last night.

## 2018-12-02 NOTE — Discharge Instructions (Addendum)
Take the medications as prescribed, it will take a week for your symptoms to improve, you can also take over-the-counter Tylenol to help with aches, pains and fever, please review the discharge instructions for additional information

## 2018-12-02 NOTE — ED Provider Notes (Signed)
[facility] EMERGENCY DEPARTMENT Provider Note   CSN: [id number] Arrival date & time: 12/02/18  1140     History   Chief Complaint Chief Complaint  Patient presents with  . Generalized Body Aches    HPI Vincent Stout is a 38 y.o. male.  HPI Patient presents to the emergency room for evaluation of body aches, cough, chest pain and fever.  Patient states the symptoms started the day before yesterday.  He began having a cough.  He is having sharp pain in his chest that increases with breathing and coughing.  Has had body aches and headache.  He has had nausea and vomiting.  Felt feverish and has had chills. History reviewed. No pertinent past medical history.  There are no active problems to display for this patient.   History reviewed. No pertinent surgical history.      Home Medications    Prior to Admission medications   Medication Sig Start Date End Date Taking? Authorizing Provider  aspirin 325 MG tablet Take 650 mg by mouth every 6 (six) hours as needed for moderate pain or headache.    [provider]  benzonatate (TESSALON) 100 MG capsule Take 1 capsule (100 mg total) by mouth every 8 (eight) hours. 12/02/18   Linwood Dibbles, MD  clotrimazole (LOTRIMIN) 1 % cream Apply 1 application topically 2 (two) times daily. 03/19/17   Tommi Rumps, PA-C  cyclobenzaprine (FLEXERIL) 10 MG tablet Take 1 tablet (10 mg total) by mouth 2 (two) times daily as needed for muscle spasms. 08/21/18   Ward, Layla Maw, DO  fluticasone (FLONASE) 50 MCG/ACT nasal spray Place 1 spray into both nostrils daily. 01/12/18   Khatri, Hina, PA-C  ibuprofen (ADVIL,MOTRIN) 800 MG tablet Take 1 tablet (800 mg total) by mouth every 8 (eight) hours as needed for mild pain. 08/21/18   Ward, Layla Maw, DO  naproxen (NAPROSYN) 500 MG tablet Take 1 tablet (500 mg total) by mouth 2 (two) times daily as needed for up to 7 days for moderate pain (fever). As needed for pain 12/02/18 12/09/18   Linwood Dibbles, MD  oseltamivir (TAMIFLU) 75 MG capsule Take 1 capsule (75 mg total) by mouth 2 (two) times daily. 12/02/18   Linwood Dibbles, MD    Family History No family history on file.  Social History Social History   Tobacco Use  . Smoking status: Former Games developer  . Smokeless tobacco: Never Used  Substance Use Topics  . Alcohol use: Yes    Comment: social  . Drug use: Yes    Types: Marijuana    Comment: daily     Allergies   Patient has no known allergies.   Review of Systems Review of Systems  All other systems reviewed and are negative.    Physical Exam Updated Vital Signs BP 132/74 (BP Location: Right Arm)   Pulse 86   Temp (!) 101.7 F (38.7 C) (Oral)   Resp 17   SpO2 100%   Physical Exam Vitals signs and nursing note reviewed.  Constitutional:      General: He is not in acute distress.    Appearance: He is well-developed.  HENT:     Head: Normocephalic and atraumatic.     Right Ear: External ear normal.     Left Ear: External ear normal.     Mouth/Throat:     Pharynx: No oropharyngeal exudate or posterior oropharyngeal erythema.  Eyes:     General: No scleral icterus.  Right eye: No discharge.        Left eye: No discharge.     Conjunctiva/sclera: Conjunctivae normal.  Neck:     Musculoskeletal: Neck supple.     Trachea: No tracheal deviation.  Cardiovascular:     Rate and Rhythm: Normal rate and regular rhythm.  Pulmonary:     Effort: Pulmonary effort is normal. No respiratory distress.     Breath sounds: Normal breath sounds. No stridor. No wheezing or rales.  Abdominal:     General: Bowel sounds are normal. There is no distension.     Palpations: Abdomen is soft.     Tenderness: There is no abdominal tenderness. There is no guarding or rebound.  Musculoskeletal:        General: No tenderness.  Skin:    General: Skin is warm and dry.     Findings: No rash.  Neurological:     Mental Status: He is alert.     Cranial Nerves: No cranial  nerve deficit (no facial droop, extraocular movements intact, no slurred speech).     Sensory: No sensory deficit.     Motor: No abnormal muscle tone or seizure activity.     Coordination: Coordination normal.      ED Treatments / Results   Radiology Dg Chest 2 View  Result Date: 12/02/2018 CLINICAL DATA:  Chest pain cough and fever EXAM: CHEST - 2 VIEW COMPARISON:  August 21, 2018 FINDINGS: Lungs are clear. Heart size and pulmonary vascular normal. No adenopathy. No bone lesions. IMPRESSION: No edema or consolidation. Electronically Signed   By: Bretta Bang III M.D.   On: 12/02/2018 12:29    Procedures Procedures (including critical care time)  Medications Ordered in ED Medications  ibuprofen (ADVIL,MOTRIN) tablet 600 mg (600 mg Oral Given 12/02/18 1154)     Initial Impression / Assessment and Plan / ED Course  I have reviewed the triage vital signs and the nursing notes.  Pertinent labs & imaging results that were available during my care of the patient were reviewed by me and considered in my medical decision making (see chart for details).   Patient presented to the emergency room with complaints of cough fever and congestion.  His symptoms are consistent with an influenza-like illness.  Chest x-ray does not show pneumonia.  Discussed supportive care, warning signs and precautions.  I also discussed the use of Tamiflu.  I explained to the patient that it is not mandatory for him to start taking this medication but he is interested it is reasonable to start.  Patient symptoms are within 48 hours.  He would prefer to start taking that medication.  Final Clinical Impressions(s) / ED Diagnoses   Final diagnoses:  Influenza-like illness    ED Discharge Orders         Ordered    naproxen (NAPROSYN) 500 MG tablet  2 times daily PRN     12/02/18 1253    oseltamivir (TAMIFLU) 75 MG capsule  2 times daily     12/02/18 1253    benzonatate (TESSALON) 100 MG capsule  Every 8  hours     12/02/18 1253           Linwood Dibbles, MD 12/02/18 1255

## 2018-12-02 NOTE — ED Notes (Signed)
Patient transported to X-ray 

## 2020-06-05 ENCOUNTER — Other Ambulatory Visit: Payer: Self-pay

## 2020-06-05 ENCOUNTER — Encounter (HOSPITAL_COMMUNITY): Payer: Self-pay | Admitting: Emergency Medicine

## 2020-06-05 ENCOUNTER — Emergency Department (HOSPITAL_COMMUNITY)
Admission: EM | Admit: 2020-06-05 | Discharge: 2020-06-05 | Disposition: A | Discharge status: Home / Self Care | Payer: Self-pay | Attending: Emergency Medicine | Admitting: Emergency Medicine

## 2020-06-05 DIAGNOSIS — R52 Pain, unspecified: Secondary | ICD-10-CM | POA: Insufficient documentation

## 2020-06-05 DIAGNOSIS — Z5321 Procedure and treatment not carried out due to patient leaving prior to being seen by health care provider: Secondary | ICD-10-CM | POA: Insufficient documentation

## 2020-06-05 LAB — CBC
HCT: 42.8 % (ref 39.0–52.0)
Hemoglobin: 14.4 g/dL (ref 13.0–17.0)
MCH: 30.4 pg (ref 26.0–34.0)
MCHC: 33.6 g/dL (ref 30.0–36.0)
MCV: 90.3 fL (ref 80.0–100.0)
Platelets: 199 10*3/uL (ref 150–400)
RBC: 4.74 MIL/uL (ref 4.22–5.81)
RDW: 12.2 % (ref 11.5–15.5)
WBC: 8 10*3/uL (ref 4.0–10.5)
nRBC: 0 % (ref 0.0–0.2)

## 2020-06-05 LAB — BASIC METABOLIC PANEL
Anion gap: 10 (ref 5–15)
BUN: 9 mg/dL (ref 6–20)
CO2: 25 mmol/L (ref 22–32)
Calcium: 9.1 mg/dL (ref 8.9–10.3)
Chloride: 101 mmol/L (ref 98–111)
Creatinine, Ser: 0.93 mg/dL (ref 0.61–1.24)
GFR calc Af Amer: >60 mL/min (ref >60)
GFR calc non Af Amer: >60 mL/min (ref >60)
Glucose, Bld: 98 mg/dL (ref 70–99)
Potassium: 4.1 mmol/L (ref 3.5–5.1)
Sodium: 136 mmol/L (ref 135–145)

## 2020-06-05 MED ORDER — SODIUM CHLORIDE 0.9% FLUSH: 3.0000 mL | Freq: Once | INTRAVENOUS | Status: DC

## 2020-06-05 NOTE — ED Notes (Signed)
[name] was encouraged to stay but decided to leave due to the wait time
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
## 2020-06-06 ENCOUNTER — Encounter (HOSPITAL_COMMUNITY): Payer: Self-pay | Admitting: Emergency Medicine

## 2020-06-06 ENCOUNTER — Ambulatory Visit (HOSPITAL_COMMUNITY)
Admission: EM | Admit: 2020-06-06 | Discharge: 2020-06-06 | Disposition: A | Discharge status: Home / Self Care | Payer: Self-pay

## 2020-06-06 ENCOUNTER — Other Ambulatory Visit: Payer: Self-pay

## 2020-06-06 DIAGNOSIS — R4020 Unspecified coma: Secondary | ICD-10-CM

## 2020-06-06 DIAGNOSIS — R109 Unspecified abdominal pain: Secondary | ICD-10-CM

## 2020-06-06 DIAGNOSIS — S0990XA Unspecified injury of head, initial encounter: Secondary | ICD-10-CM

## 2020-06-06 NOTE — ED Provider Notes (Signed)
[facility]    CSN: [id number] Arrival date & time: 06/06/20  0805      History   Chief Complaint Chief Complaint  Patient presents with  . Flank Pain    HPI Vincent Stout is a 39 y.o. male.   Patient reports urgent care initially for flank pain and possible exposure to chlamydia.  However he gives back story that he had an episode of loss of consciousness this past Saturday which was 2 days ago.  Describes an episode where he very lightheaded fainted fell forward and hit his head on the couch.  He reports blacking out at this point, then awakening and not being able to move his arms or his hands.  He reports it took some time for him to be able to come to fully and able to ambulate.  He reports he was feeling better the next day however yesterday he got lightheaded again and had some body ache which prompted him to go to the emergency department.  He did not stay to be seen by a provider.  He reports he has lightheadedness often, specifically when he gets hot.  He reports during this last episode he took a few sips of alcohol, and endorses only a few sips just prior to passing out.  He endorses some frontal headache and pain where he hit his head.  He does not report any numbness or weakness.  He does endorse some right flank pain that started yesterday as well.  No reported vomiting.  Patient has had similar episodes in the past, went to the hospital in 2019 for similar.  He never had follow-up for this.  No chest pain or shortness of breath.     History reviewed. No pertinent past medical history.  There are no problems to display for this patient.   History reviewed. No pertinent surgical history.     Home Medications    Prior to Admission medications   Medication Sig Start Date End Date Taking? Authorizing Provider  aspirin 325 MG tablet Take 650 mg by mouth every 6 (six) hours as needed for moderate pain or headache.    [provider]  benzonatate  (TESSALON) 100 MG capsule Take 1 capsule (100 mg total) by mouth every 8 (eight) hours. 12/02/18   Linwood Dibbles, MD  clotrimazole (LOTRIMIN) 1 % cream Apply 1 application topically 2 (two) times daily. 03/19/17   Tommi Rumps, PA-C  cyclobenzaprine (FLEXERIL) 10 MG tablet Take 1 tablet (10 mg total) by mouth 2 (two) times daily as needed for muscle spasms. 08/21/18   Ward, Layla Maw, DO  fluticasone (FLONASE) 50 MCG/ACT nasal spray Place 1 spray into both nostrils daily. 01/12/18   Khatri, Hina, PA-C  ibuprofen (ADVIL,MOTRIN) 800 MG tablet Take 1 tablet (800 mg total) by mouth every 8 (eight) hours as needed for mild pain. 08/21/18   Ward, Layla Maw, DO  oseltamivir (TAMIFLU) 75 MG capsule Take 1 capsule (75 mg total) by mouth 2 (two) times daily. 12/02/18   Linwood Dibbles, MD    Family History Family History  Problem Relation Age of Onset  . Hypertension Father     Social History Social History   Tobacco Use  . Smoking status: Former Games developer  . Smokeless tobacco: Never Used  Vaping Use  . Vaping Use: Never used  Substance Use Topics  . Alcohol use: Yes    Comment: social  . Drug use: Yes    Types: Marijuana    Comment:  daily     Allergies   Patient has no known allergies.   Review of Systems Review of Systems   Physical Exam Triage Vital Signs ED Triage Vitals [06/06/20 0815]  Enc Vitals Group     BP      Pulse      Resp      Temp      Temp src      SpO2      Weight      Height      Head Circumference      Peak Flow      Pain Score 8     Pain Loc      Pain Edu?      Excl. in GC?    No data found.  Updated Vital Signs BP 113/72 (BP Location: Right Arm)   Pulse 64   Temp 98.3 F (36.8 C) (Oral)   Resp 20   SpO2 97%   Visual Acuity Right Eye Distance:   Left Eye Distance:   Bilateral Distance:    Right Eye Near:   Left Eye Near:    Bilateral Near:     Physical Exam Vitals and nursing note reviewed.  Constitutional:      General: He is not in acute  distress.    Appearance: He is well-developed. He is not ill-appearing.  HENT:     Head: Normocephalic and atraumatic.     Comments: Superficial abrasion to the right forehead.  There is some tenderness in the right forehead.  No significant swelling or hematoma. Eyes:     Extraocular Movements: Extraocular movements intact.     Conjunctiva/sclera: Conjunctivae normal.     Pupils: Pupils are equal, round, and reactive to light.  Cardiovascular:     Rate and Rhythm: Normal rate and regular rhythm.     Heart sounds: No murmur heard.   Pulmonary:     Effort: Pulmonary effort is normal. No respiratory distress.     Breath sounds: Normal breath sounds.  Abdominal:     Palpations: Abdomen is soft.     Tenderness: There is no abdominal tenderness.  Musculoskeletal:        General: Normal range of motion.     Cervical back: Normal range of motion and neck supple. No tenderness.     Right lower leg: No edema.     Left lower leg: No edema.  Skin:    General: Skin is warm and dry.  Neurological:     General: No focal deficit present.     Mental Status: He is alert and oriented to person, place, and time.     Motor: No weakness.     Coordination: Coordination normal.     Gait: Gait normal.     Comments: Patient is freely moving all limbs and is ambulatory without issue.  Grossly neurologically intact.  Speech is fluent.      UC Treatments / Results  Labs (all labs ordered are listed, but only abnormal results are displayed) Labs Reviewed - No data to display  EKG   Radiology No results found.  Procedures Procedures (including critical care time)  Medications Ordered in UC Medications - No data to display  Initial Impression / Assessment and Plan / UC Course  I have reviewed the triage vital signs and the nursing notes.  Pertinent labs & imaging results that were available during my care of the patient were reviewed by me and considered in my medical decision making (see  chart for details).     #Loss of consciousness #Head injury #Flank pain Patient is a 39 year old with history of syncopal episodes in 2019 presenting with recent syncopal episode and head injury.  He also reports right flank pain today.  Given patient's negligence of loss of consciousness and head injury, with inability to move limbs, feel that he needs to return to the emergency department for further work-up.  Labs and EKG were largely unrevealing from urgency department visit.  He is grossly neurologically well here, however given the history reported feel he needs higher level of evaluation with possible imaging today.  Instructed patient that he needs to report back to the [facility] emergency department as we cannot rule out more life-threatening or serious things in the urgent care.  Patient was brought by a friend and they are here, feel he can self report.  Stressed the importance of staying to be evaluated by provider.  Patient verbalized understanding. Final Clinical Impressions(s) / UC Diagnoses   Final diagnoses:  Loss of consciousness (HCC)  Injury of head, initial encounter  Flank pain     Discharge Instructions     Given your episode of falling out and hitting your head, you need high level of evaluation at the Emergency department  It is important you be evaluated by a provider for this    ED Prescriptions    None     PDMP not reviewed this encounter.   Hermelinda Medicus, PA-C 06/06/20 [phone]

## 2020-06-06 NOTE — ED Notes (Signed)
Patient is being discharged from the Urgent Care and sent to the Emergency Department via private vehicle. Per Cam Hai, APP, patient is in need of higher level of care. Patient is aware and verbalizes understanding of plan of care.  Vitals:   06/06/20 0817  BP: 113/72  Pulse: 64  Resp: 20  Temp: 98.3 F (36.8 C)  SpO2: 97%

## 2020-06-06 NOTE — ED Triage Notes (Signed)
Pt presents to [facility] for assessment of right flank pain starting yesterday, has not improved.  Left AMA from the ER, blood work done.  Patient states he had an exposure to chlamydia recently.  States he is having difficulty starting his urination, denies any other urinary changes at this time.

## 2020-06-06 NOTE — Discharge Instructions (Addendum)
Given your episode of falling out and hitting your head, you need high level of evaluation at the Emergency department  It is important you be evaluated by a provider for this

## 2020-06-14 ENCOUNTER — Encounter (HOSPITAL_COMMUNITY): Payer: Self-pay | Admitting: Emergency Medicine

## 2020-06-14 ENCOUNTER — Emergency Department (HOSPITAL_COMMUNITY)
Admission: EM | Admit: 2020-06-14 | Discharge: 2020-06-14 | Disposition: A | Discharge status: Home / Self Care | Payer: Self-pay | Attending: Emergency Medicine | Admitting: Emergency Medicine

## 2020-06-14 DIAGNOSIS — M545 Low back pain, unspecified: Secondary | ICD-10-CM

## 2020-06-14 DIAGNOSIS — K5900 Constipation, unspecified: Secondary | ICD-10-CM | POA: Insufficient documentation

## 2020-06-14 DIAGNOSIS — Z87891 Personal history of nicotine dependence: Secondary | ICD-10-CM | POA: Insufficient documentation

## 2020-06-14 DIAGNOSIS — N5082 Scrotal pain: Secondary | ICD-10-CM | POA: Insufficient documentation

## 2020-06-14 DIAGNOSIS — Z79899 Other long term (current) drug therapy: Secondary | ICD-10-CM | POA: Insufficient documentation

## 2020-06-14 LAB — CBC
HCT: 43.9 % (ref 39.0–52.0)
Hemoglobin: 14.6 g/dL (ref 13.0–17.0)
MCH: 30 pg (ref 26.0–34.0)
MCHC: 33.3 g/dL (ref 30.0–36.0)
MCV: 90.3 fL (ref 80.0–100.0)
Platelets: 217 10*3/uL (ref 150–400)
RBC: 4.86 MIL/uL (ref 4.22–5.81)
RDW: 12.4 % (ref 11.5–15.5)
WBC: 10.1 10*3/uL (ref 4.0–10.5)
nRBC: 0 % (ref 0.0–0.2)

## 2020-06-14 LAB — GC/CHLAMYDIA PROBE AMP (~~LOC~~) NOT AT ARMC
Chlamydia: NEGATIVE
Comment: NEGATIVE
Comment: NORMAL
Neisseria Gonorrhea: NEGATIVE

## 2020-06-14 LAB — COMPREHENSIVE METABOLIC PANEL
ALT: 22 U/L (ref 0–44)
AST: 19 U/L (ref 15–41)
Albumin: 4 g/dL (ref 3.5–5.0)
Alkaline Phosphatase: 36 U/L — ABNORMAL LOW (ref 38–126)
Anion gap: 8 (ref 5–15)
BUN: 8 mg/dL (ref 6–20)
CO2: 26 mmol/L (ref 22–32)
Calcium: 8.9 mg/dL (ref 8.9–10.3)
Chloride: 104 mmol/L (ref 98–111)
Creatinine, Ser: 1 mg/dL (ref 0.61–1.24)
GFR calc Af Amer: >60 mL/min (ref >60)
GFR calc non Af Amer: >60 mL/min (ref >60)
Glucose, Bld: 101 mg/dL — ABNORMAL HIGH (ref 70–99)
Potassium: 4 mmol/L (ref 3.5–5.1)
Sodium: 138 mmol/L (ref 135–145)
Total Bilirubin: 0.6 mg/dL (ref 0.3–1.2)
Total Protein: 7 g/dL (ref 6.5–8.1)

## 2020-06-14 LAB — URINALYSIS, ROUTINE W REFLEX MICROSCOPIC
Bilirubin Urine: NEGATIVE
Glucose, UA: NEGATIVE mg/dL
Hgb urine dipstick: NEGATIVE
Ketones, ur: NEGATIVE mg/dL
Leukocytes,Ua: NEGATIVE
Nitrite: NEGATIVE
Protein, ur: NEGATIVE mg/dL
Specific Gravity, Urine: 1.016 (ref 1.005–1.030)
pH: 6 (ref 5.0–8.0)

## 2020-06-14 LAB — LIPASE, BLOOD: Lipase: 35 U/L (ref 11–51)

## 2020-06-14 MED ORDER — METHOCARBAMOL 500 MG PO TABS
1000.0000 mg | ORAL_TABLET | Freq: Four times a day (QID) | ORAL | 0 refills | Status: DC
Start: 2020-06-14 — End: 2021-11-29

## 2020-06-14 MED ORDER — POLYETHYLENE GLYCOL 3350 17 GM/SCOOP PO POWD: 17.0000 g | Freq: Two times a day (BID) | ORAL | 0 refills | Status: DC | PRN

## 2020-06-14 MED ORDER — NAPROXEN 500 MG PO TABS
500.0000 mg | ORAL_TABLET | Freq: Two times a day (BID) | ORAL | 0 refills | Status: DC
Start: 2020-06-14 — End: 2021-11-29

## 2020-06-14 NOTE — ED Triage Notes (Signed)
Patient reports lower abd pain, flank pain, testicle pain X several days. Was seen at ED and LWBS d/t wait time. Reports possible exposure to chlamydia

## 2020-06-14 NOTE — ED Notes (Signed)
Pt stepped outside, but stated he will return.

## 2020-06-14 NOTE — Discharge Instructions (Signed)
Please read and follow all provided instructions.  Your diagnoses today include:  1. Acute bilateral low back pain without sciatica   2. Constipation, unspecified constipation type   3. Scrotal pain     Tests performed today include:  Blood counts and electrolytes  Blood tests to check liver and kidney function  Blood tests to check pancreas function  Urine test to look for infection and pregnancy (in women)    Urine test for gonorrhea and chlamydia - negative for both  Vital signs. See below for your results today.   Medications prescribed:   Miralax - laxative  This medication can be found over-the-counter.    Robaxin (methocarbamol) - muscle relaxer medication  DO NOT drive or perform any activities that require you to be awake and alert because this medicine can make you drowsy.    Naproxen - anti-inflammatory pain medication  Do not exceed 500mg  naproxen every 12 hours, take with food  You have been prescribed an anti-inflammatory medication or NSAID. Take with food. Take smallest effective dose for the shortest duration needed for your pain. Stop taking if you experience stomach pain or vomiting.   Take any prescribed medications only as directed.  Home care instructions:   Follow any educational materials contained in this packet.  Follow-up instructions: Please follow-up with your primary care provider in the next 7 days for further evaluation of your symptoms.    Return instructions:  SEEK IMMEDIATE MEDICAL ATTENTION IF:  The pain does not go away or becomes severe   A temperature above 101F develops   Repeated vomiting occurs (multiple episodes)   The pain becomes localized to portions of the abdomen. The right side could possibly be appendicitis. In an adult, the left lower portion of the abdomen could be colitis or diverticulitis.   Blood is being passed in stools or vomit (bright red or black tarry stools)   You develop chest pain, difficulty  breathing, dizziness or fainting, or become confused, poorly responsive, or inconsolable (young children)  If you have any other emergent concerns regarding your health  Additional Information: Abdominal (belly) pain can be caused by many things. Your caregiver performed an examination and possibly ordered blood/urine tests and imaging (CT scan, x-rays, ultrasound). Many cases can be observed and treated at home after initial evaluation in the emergency department. Even though you are being discharged home, abdominal pain can be unpredictable. Therefore, you need a repeated exam if your pain does not resolve, returns, or worsens. Most patients with abdominal pain don't have to be admitted to the hospital or have surgery, but serious problems like appendicitis and gallbladder attacks can start out as nonspecific pain. Many abdominal conditions cannot be diagnosed in one visit, so follow-up evaluations are very important.  Your vital signs today were: BP (!) 104/59 (BP Location: Right Arm)   Pulse 72   Temp 98.9 F (37.2 C) (Oral)   Resp 16   Ht 5\' 11"  (1.803 m)   Wt 83.9 kg   SpO2 100%   BMI 25.80 kg/m  If your blood pressure (bp) was elevated above 135/85 this visit, please have this repeated by your doctor within one month. --------------

## 2020-06-14 NOTE — ED Provider Notes (Signed)
[facility] EMERGENCY DEPARTMENT Provider Note   CSN: [id number] Arrival date & time: 06/14/20  0244     History Chief Complaint  Patient presents with  . Abdominal Pain  . Testicle Pain    Vincent Stout is a 39 y.o. male.  Patient with no past surgical history presents the emergency department with complaint of constipation and abdominal pain over the past 4 days with testicle pain over the past 3 days.  Patient had a recent possible exposure to chlamydia and is questioning whether that is causing his symptoms.  He denies dysuria, penile discharge, blood in the urine.  He questions whether he has a hernia but denies any bulging or pain in the groin.  States that he typically has a bowel movement approximately every week.  Has not had a bowel movement in past 4 days.  Pain in the lower back is across the lower back and does not radiate.  It is worse with movement and bending.  Patient denies warning symptoms of back pain including: fecal incontinence, urinary retention or overflow incontinence, night sweats, waking from sleep with back pain, unexplained fevers or weight loss, h/o cancer, IVDU, recent trauma.           History reviewed. No pertinent past medical history.  There are no problems to display for this patient.   History reviewed. No pertinent surgical history.     Family History  Problem Relation Age of Onset  . Hypertension Father     Social History   Tobacco Use  . Smoking status: Former Games developer  . Smokeless tobacco: Never Used  Vaping Use  . Vaping Use: Never used  Substance Use Topics  . Alcohol use: Yes    Comment: social  . Drug use: Yes    Types: Marijuana    Comment: daily    Home Medications Prior to Admission medications   Medication Sig Start Date End Date Taking? Authorizing Provider  ibuprofen (ADVIL,MOTRIN) 800 MG tablet Take 1 tablet (800 mg total) by mouth every 8 (eight) hours as needed for mild pain. Patient  taking differently: Take 500 mg by mouth 3 (three) times daily as needed for mild pain.  08/21/18  Yes Ward, Layla Maw, DO  Sennosides (LAXATIVE PILLS PO) Take 2 tablets by mouth 2 (two) times daily as needed.   Yes [provider]  Simethicone (GAS-X PO) Take 1 tablet by mouth 2 (two) times daily as needed.   Yes [provider]  methocarbamol (ROBAXIN) 500 MG tablet Take 2 tablets (1,000 mg total) by mouth 4 (four) times daily. 06/14/20   Renne Crigler, PA-C  naproxen (NAPROSYN) 500 MG tablet Take 1 tablet (500 mg total) by mouth 2 (two) times daily. 06/14/20   Renne Crigler, PA-C  polyethylene glycol powder (GLYCOLAX/MIRALAX) 17 GM/SCOOP powder Take 17 g by mouth 2 (two) times daily as needed for mild constipation or moderate constipation. 06/14/20   Renne Crigler, PA-C  fluticasone (FLONASE) 50 MCG/ACT nasal spray Place 1 spray into both nostrils daily. 01/12/18 06/14/20  Dietrich Pates, PA-C    Allergies    Patient has no known allergies.  Review of Systems   Review of Systems  Constitutional: Negative for fever and unexpected weight change.  HENT: Negative for rhinorrhea and sore throat.   Eyes: Negative for redness.  Respiratory: Negative for cough.   Cardiovascular: Negative for chest pain.  Gastrointestinal: Positive for abdominal pain and constipation. Negative for diarrhea, nausea and vomiting.  Neg for fecal incontinence  Genitourinary: Positive for testicular pain. Negative for difficulty urinating, dysuria, hematuria and scrotal swelling.       Negative for urinary incontinence or retention  Musculoskeletal: Positive for back pain. Negative for myalgias.  Skin: Negative for rash.  Neurological: Negative for weakness, numbness and headaches.       Negative for saddle paresthesias     Physical Exam Updated Vital Signs BP (!) 104/59 (BP Location: Right Arm)   Pulse 72   Temp 98.9 F (37.2 C) (Oral)   Resp 16   Ht 5\' 11"  (1.803 m)   Wt 83.9 kg   SpO2  100%   BMI 25.80 kg/m   Physical Exam Vitals and nursing note reviewed. Exam conducted with a chaperone present.  Constitutional:      Appearance: He is well-developed.  HENT:     Head: Normocephalic and atraumatic.  Eyes:     General:        Right eye: No discharge.        Left eye: No discharge.     Conjunctiva/sclera: Conjunctivae normal.  Cardiovascular:     Rate and Rhythm: Normal rate and regular rhythm.     Heart sounds: Normal heart sounds.  Pulmonary:     Effort: Pulmonary effort is normal.     Breath sounds: Normal breath sounds.  Abdominal:     Palpations: Abdomen is soft.     Tenderness: There is no abdominal tenderness.     Hernia: There is no hernia in the left inguinal area or right inguinal area.     Comments: Patient with no abdominal pain/tenderness at time of exam.  Genitourinary:    Penis: Normal.      Testes:        Right: Tenderness or swelling not present.        Left: Tenderness or swelling not present.     Epididymis:     Right: Normal. No tenderness.     Left: Normal. No tenderness.     Comments: Genital exam performed with RN chaperone .  Normal GU exam. Musculoskeletal:     Cervical back: Normal, normal range of motion and neck supple.     Thoracic back: Normal.     Lumbar back: Tenderness present. No spasms.     Comments: Bilateral lumbar paraspinous tenderness bilaterally.  Skin:    General: Skin is warm and dry.  Neurological:     Mental Status: He is alert.  Psychiatric:        Mood and Affect: Mood is anxious.     ED Results / Procedures / Treatments   Labs (all labs ordered are listed, but only abnormal results are displayed) Labs Reviewed  COMPREHENSIVE METABOLIC PANEL - Abnormal; Notable for the following components:      Result Value   Glucose, Bld 101 (*)    Alkaline Phosphatase 36 (*)    All other components within normal limits  LIPASE, BLOOD  CBC  URINALYSIS, ROUTINE W REFLEX MICROSCOPIC  GC/CHLAMYDIA PROBE  AMP ([place]) NOT AT [facility]    EKG None  Radiology No results found.  Procedures Procedures (including critical care time)  Medications Ordered in ED Medications - No data to display  ED Course  I have reviewed the triage vital signs and the nursing notes.  Pertinent labs & imaging results that were available during my care of the patient were reviewed by me and considered in my medical decision making (see chart for details).  Patient seen and examined.  Reviewed lab work with patient at bedside including negative test for gonorrhea chlamydia.  Genital exam performed with RN chaperone.  Exam and work-up are very reassuring.  Back pain: Likely musculoskeletal given tenderness with palpation and worsening with movement.  No signs of sciatica.  Will treat with NSAID and Robaxin. Patient counseled on proper use of muscle relaxant medication.  They were told not to drink alcohol, drive any vehicle, or do any dangerous activities while taking this medication.  Patient verbalized understanding.  Constipation: Could be contributing to abdominal pain.  Will give MiraLAX.  The patient was urged to return to the Emergency Department immediately with worsening of current symptoms, worsening abdominal pain, persistent vomiting, blood noted in stools, fever, or any other concerns. The patient verbalized understanding.   Testicle pain: Normal external exam.  Normal UA.  No signs of epididymitis or torsion.  Do not feel that ultrasound is indicated at this time.  Vital signs reviewed and are as follows: BP (!) 104/59 (BP Location: Right Arm)   Pulse 72   Temp 98.9 F (37.2 C) (Oral)   Resp 16   Ht 5\' 11"  (1.803 m)   Wt 83.9 kg   SpO2 100%   BMI 25.80 kg/m      MDM Rules/Calculators/A&P                          Patient with abdominal pain, testicle pain, back pain. Vitals are stable, no fever. Labs are normal. Imaging not felt indicated. No signs of dehydration, patient is tolerating  PO's. Lungs are clear and no signs suggestive of PNA. Low concern for appendicitis, cholecystitis, pancreatitis, ruptured viscus, UTI, kidney stone, aortic dissection, aortic aneurysm or other emergent abdominal etiology. Supportive therapy indicated with return if symptoms worsen.    Final Clinical Impression(s) / ED Diagnoses Final diagnoses:  Acute bilateral low back pain without sciatica  Constipation, unspecified constipation type  Scrotal pain    Rx / DC Orders ED Discharge Orders         Ordered    polyethylene glycol powder (GLYCOLAX/MIRALAX) 17 GM/SCOOP powder  2 times daily PRN     Discontinue  Reprint     06/14/20 1244    methocarbamol (ROBAXIN) 500 MG tablet  4 times daily     Discontinue  Reprint     06/14/20 1244    naproxen (NAPROSYN) 500 MG tablet  2 times daily     Discontinue  Reprint     06/14/20 1244           06/16/20, PA-C 06/14/20 1316    06/16/20, MD 06/14/20 1433

## 2020-06-14 NOTE — ED Notes (Signed)
Pt returned from outside

## 2021-07-05 ENCOUNTER — Encounter (HOSPITAL_COMMUNITY): Payer: Self-pay

## 2021-07-05 ENCOUNTER — Other Ambulatory Visit: Payer: Self-pay

## 2021-07-05 ENCOUNTER — Emergency Department (HOSPITAL_COMMUNITY): Payer: Medicaid Other

## 2021-07-05 ENCOUNTER — Emergency Department (HOSPITAL_COMMUNITY)
Admission: EM | Admit: 2021-07-05 | Discharge: 2021-07-06 | Disposition: A | Discharge status: Home / Self Care | Payer: Medicaid Other | Attending: Emergency Medicine | Admitting: Emergency Medicine

## 2021-07-05 DIAGNOSIS — Z5321 Procedure and treatment not carried out due to patient leaving prior to being seen by health care provider: Secondary | ICD-10-CM | POA: Insufficient documentation

## 2021-07-05 DIAGNOSIS — R1030 Lower abdominal pain, unspecified: Secondary | ICD-10-CM | POA: Insufficient documentation

## 2021-07-05 NOTE — ED Provider Notes (Signed)
Emergency Medicine Provider Triage Evaluation Note  Vincent Stout , a 40 y.o. male  was evaluated in triage.  Pt complains of constipation and perineal pain.  He was seen and evaluated at urgent care and was told he was constipated and was treated for STDs.  STD test came back negative and he stopped antibiotics.  Has not had a bowel movement in over a week.  OTC remedies offer no improvement.  Review of Systems  Positive: Abdominal distention Negative: Nausea, vomiting, fever, chills  Physical Exam  BP 107/71   Pulse 63   Temp 98.2 F (36.8 C)   Resp 20   Ht 5\' 11"  (1.803 m)   Wt 83 kg   SpO2 96%   BMI 25.52 kg/m  Gen:   Awake, no distress   Resp:  Normal effort  MSK:   Moves extremities without difficulty  Other:  Abdominal exam reveals mild distention without any tenderness.  Patient has point tenderness to the perineal region.  Medical Decision Making  Medically screening exam initiated at 5:25 PM.  Appropriate orders placed.  Vincent Stout was informed that the remainder of the evaluation will be completed by another provider, this initial triage assessment does not replace that evaluation, and the importance of remaining in the ED until their evaluation is complete.     Carlean Jews [place], PA-C 07/05/21 1726    09/04/21, MD 07/07/21 1225

## 2021-07-05 NOTE — ED Triage Notes (Signed)
Patient states he feels pressure to groin area x 5 days went to Urgent care and was negative for STD and prescribed medication for Constipation.

## 2021-07-06 NOTE — ED Notes (Signed)
Returned labels and notified staff of leaving at 2112.

## 2021-11-29 ENCOUNTER — Other Ambulatory Visit: Payer: Self-pay

## 2021-11-29 ENCOUNTER — Emergency Department (HOSPITAL_BASED_OUTPATIENT_CLINIC_OR_DEPARTMENT_OTHER): Payer: Self-pay

## 2021-11-29 ENCOUNTER — Encounter (HOSPITAL_BASED_OUTPATIENT_CLINIC_OR_DEPARTMENT_OTHER): Payer: Self-pay | Admitting: *Deleted

## 2021-11-29 ENCOUNTER — Emergency Department (HOSPITAL_BASED_OUTPATIENT_CLINIC_OR_DEPARTMENT_OTHER)
Admission: EM | Admit: 2021-11-29 | Discharge: 2021-11-29 | Disposition: A | Discharge status: Home / Self Care | Payer: Self-pay | Attending: Emergency Medicine | Admitting: Emergency Medicine

## 2021-11-29 DIAGNOSIS — X500XXA Overexertion from strenuous movement or load, initial encounter: Secondary | ICD-10-CM | POA: Insufficient documentation

## 2021-11-29 DIAGNOSIS — R21 Rash and other nonspecific skin eruption: Secondary | ICD-10-CM | POA: Insufficient documentation

## 2021-11-29 DIAGNOSIS — S93401A Sprain of unspecified ligament of right ankle, initial encounter: Secondary | ICD-10-CM | POA: Insufficient documentation

## 2021-11-29 DIAGNOSIS — R531 Weakness: Secondary | ICD-10-CM

## 2021-11-29 MED ORDER — IBUPROFEN 800 MG PO TABS: 800.0000 mg | ORAL_TABLET | Freq: Three times a day (TID) | ORAL | 0 refills | Status: DC | PRN

## 2021-11-29 MED ORDER — IBUPROFEN 800 MG PO TABS
800.0000 mg | ORAL_TABLET | Freq: Once | ORAL | Status: AC
  Administered 2021-11-29: 800 mg via ORAL
  Filled 2021-11-29: qty 1

## 2021-11-29 NOTE — ED Provider Notes (Signed)
[facility] EMERGENCY DEPARTMENT  Provider Note  CSN: [id number] Arrival date & time: 11/29/21 0750  History Chief Complaint  Patient presents with   Ankle Pain    Vincent Stout is a 41 y.o. male reports he twisted his R ankle chasing his dogs in the wet grass last night. He is complaining of pain to the lateral R ankle worse with bearing weight. He has several other chronic complaints, including issues with his "entire R side" ongoing for over a years since seeing a chiropractor. He states he has been seen several times at [facility] for same but symptoms continue. He also reports dry skin rash on his hands for several weeks.    Home Medications Prior to Admission medications   Medication Sig Start Date End Date Taking? Authorizing Provider  ibuprofen (ADVIL) 800 MG tablet Take 1 tablet (800 mg total) by mouth every 8 (eight) hours as needed for mild pain. 11/29/21   Truddie Hidden, MD  fluticasone (FLONASE) 50 MCG/ACT nasal spray Place 1 spray into both nostrils daily. 01/12/18 06/14/20  Delia Heady, PA-C     Allergies    Patient has no known allergies.   Review of Systems   Review of Systems Please see HPI for pertinent positives and negatives  Physical Exam BP 129/78 (BP Location: Left Arm)    Pulse 76    Temp 98.1 F (36.7 C) (Oral)    Resp 18    Ht 5\' 11"  (1.803 m)    Wt 80.3 kg    SpO2 97%    BMI 24.69 kg/m   Physical Exam Vitals and nursing note reviewed.  HENT:     Head: Normocephalic.     Nose: Nose normal.  Eyes:     Extraocular Movements: Extraocular movements intact.  Pulmonary:     Effort: Pulmonary effort is normal.  Musculoskeletal:        General: Swelling and tenderness present. Normal range of motion.     Cervical back: Neck supple.     Comments: Tenderness over the R lateral malleolus, no tenderness base of 5th metatarsal  Skin:    Findings: Rash (dry skin on dorsum of both hands) present.  Neurological:     General: No focal deficit  present.     Mental Status: He is alert and oriented to person, place, and time.     Sensory: No sensory deficit.     Motor: No weakness.  Psychiatric:        Mood and Affect: Mood normal.    ED Results / Procedures / Treatments   EKG None  Procedures Procedures  Medications Ordered in the ED Medications  ibuprofen (ADVIL) tablet 800 mg (has no administration in time range)    Initial Impression and Plan  Patient advised he would need to see PCP for management of his chronic issues. Will address his acute complaint of ankle injury in the ED today.   ED Course   Clinical Course as of 11/29/21 0901  Thu Nov 29, 2021  0856 Xray reviewed and neg for acute injury. He has remote injury from 20 years ago. Will provide ASO, crutches and motrin. He clarifies that he has had 'weakness' and leg shaking on his R side for over a year and would like to be referred to a Neurologist. He does not have any focal deficit today.  [CS]    Clinical Course User Index [CS] Truddie Hidden, MD     MDM Rules/Calculators/A&P Medical Decision Making  Problems Addressed: Sprain of right ankle, unspecified ligament, initial encounter: acute illness or injury Weakness: chronic illness or injury  Amount and/or Complexity of Data Reviewed Radiology: ordered and independent interpretation performed. Decision-making details documented in ED Course.  Risk Prescription drug management.    Final Clinical Impression(s) / ED Diagnoses Final diagnoses:  Sprain of right ankle, unspecified ligament, initial encounter  Weakness    Rx / DC Orders ED Discharge Orders          Ordered    Ambulatory referral to Neurology       Comments: An appointment is requested in approximately: 2 weeks   11/29/21 0859    ibuprofen (ADVIL) 800 MG tablet  Every 8 hours PRN        11/29/21 0901             Truddie Hidden, MD 11/29/21 [phone]

## 2021-11-29 NOTE — ED Notes (Signed)
Rt foot appears swollen, primarily at rt lateral ankle area, very tender to touch, color and capillary refill WNL. Rt pedal pulse noted. Elevation and Ice pack applied to injured site. Comfort measures provided.

## 2021-11-29 NOTE — ED Notes (Signed)
Requested pharmacy and med list reviewed with client

## 2021-11-29 NOTE — ED Triage Notes (Signed)
Presents with rt ankle pain, states he injured his ankle last pm at home when chasing some dogs, thinks he rolled his rt ankle twice

## 2022-01-28 ENCOUNTER — Ambulatory Visit: Payer: Self-pay | Admitting: Neurology

## 2022-01-28 ENCOUNTER — Encounter: Payer: Self-pay | Admitting: Neurology

## 2022-02-26 ENCOUNTER — Encounter: Payer: Self-pay | Admitting: Neurology

## 2022-02-26 ENCOUNTER — Telehealth: Payer: Self-pay | Admitting: Neurology

## 2022-02-26 ENCOUNTER — Ambulatory Visit (INDEPENDENT_AMBULATORY_CARE_PROVIDER_SITE_OTHER): Payer: Self-pay | Admitting: Neurology

## 2022-02-26 VITALS — BP 124/65 | HR 69 | Ht 71.0 in | Wt 172.5 lb

## 2022-02-26 DIAGNOSIS — R410 Disorientation, unspecified: Secondary | ICD-10-CM

## 2022-02-26 DIAGNOSIS — M542 Cervicalgia: Secondary | ICD-10-CM

## 2022-02-26 DIAGNOSIS — R269 Unspecified abnormalities of gait and mobility: Secondary | ICD-10-CM

## 2022-02-26 DIAGNOSIS — R29898 Other symptoms and signs involving the musculoskeletal system: Secondary | ICD-10-CM

## 2022-02-26 MED ORDER — GABAPENTIN 300 MG PO CAPS: 300.0000 mg | ORAL_CAPSULE | Freq: Three times a day (TID) | ORAL | 11 refills | Status: DC

## 2022-02-26 NOTE — Progress Notes (Signed)
? ?Chief Complaint  ?Patient presents with  ? New Patient (Initial Visit)  ?  Room 14 w/ significant other, Vincent Stout. Reports progressive right leg weakness over the last year. He has chronic low back pain. Feels his has lost right hand grip strength. Also has noticed unexplained weight loss.  ? ? ? ? ?ASSESSMENT AND PLAN ? ?Vincent Stout is a 41 y.o. male   ?Subacute onset of gait abnormality, right leg weakness, urinary hesitation, neck pain radiating pain to right arm, right low back pain radiating pain to right lower extremity since 2021 ? Hyperreflexia of both upper and lower extremity, right distal more than proximal leg weakness, ? MRI of cervical spine to rule out cervical spondylitic myelopathy, MRI of lumbar spine to rule out superimposed right lumbar radiculopathy ? Gabapentin 300 mg 3 times daily ? ?Transient confusion, nonresponsiveness ? Suspicious for partial seizure, MRI of the brain with without contrast ? EEG, ? Return to clinic in 1 month ? ?DIAGNOSTIC DATA (LABS, IMAGING, TESTING) ?- I reviewed patient records, labs, notes, testing and imaging myself where available. ? ? ?MEDICAL HISTORY: ? ?Vincent, Stout a 41 year old male, accompanied by his significant other Vincent Stout for evaluation of gait abnormality, seen in request by Vincent Stout, initial evaluation was on Feb 26, 2022 ? ?I reviewed and summarized the referring note. PMHX. ?He smokes marijuana regularly ? 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 ?In 2021, he developed gait abnormality, initially he describes significant right-sided low back pain, radiating pain to right lower extremity, difficulty lifting his right leg up, later on also developed right leg uncontrollable jerking movement, ? ?Now he also complains of neck pain radiating pain to right shoulder, he  reported history of right fifth finger injury as a child punching accident, already has some deformity of right fifth finger, his right hand is weaker, difficult to make a tight grip, ? ?He is tearful at today's visit, " does not want to live like this", after his emotional outburst, he suddenly went silent, I witnessed that he had staring, decreased facial expression, mild confusion, lasting less than 1 minute, then gradually came back to his normal self, ? ?PHYSICAL EXAM: ?  ?Vitals:  ? 02/26/22 0937  ?BP: 124/65  ?Pulse: 69  ?Weight: 172 lb 8 oz (78.2 kg)  ?Height: 5\' 11"  (1.803 m)  ? ?Not recorded ?  ? ? ?Body mass index is 24.06 kg/m?. ? ?PHYSICAL EXAMNIATION: ? ?Gen: NAD, conversant, well nourised, well groomed                     ?Cardiovascular: Regular rate rhythm, no peripheral edema, warm, nontender. ?Eyes: Conjunctivae clear without exudates or hemorrhage ?Neck: Supple, no carotid bruits. ?Pulmonary: Clear to auscultation bilaterally  ? ?NEUROLOGICAL EXAM: ? ?MENTAL STATUS: ?Speech/Cognition: ?   Awake, alert, oriented to history taking and casual conversation, poor historian ?CRANIAL NERVES: ?CN II: Visual fields are full to confrontation. Pupils are round equal and briskly reactive to light. ?CN III, IV, VI: extraocular movement are normal. No ptosis. ?CN V: Facial sensation is intact to light touch ?CN VII: Face is symmetric with normal eye closure  ?CN VIII: Hearing is normal to causal conversation. ?CN IX, X: Phonation is normal. ?CN XI: Head turning and shoulder shrug are intact ? ?MOTOR: Mild right arm extension, abduction weakness,  deformity of right fifth metacarpal phalangeal joint, mild right hand finger abduction weakness. ? ?UE Shoulder Abduction Shoulder External Rotation Elbow ?Flexion Wrist ?Flexion Wrist ?Extension Grip Finger  ?Abduction  ?R 5- 5- 5 5 5  5- 5-  ?L 5 5 5 5 5 5 5   ? ?LE Hip Flexion Knee flexion Knee extension Ankle Dorsiflexion Eversion Ankle plantar Flexion Inversion Toe  flexion/Extension  ?R 3 4 4 3 3 4 4  5/5  ?L 5 5 5 5 5 5 5  5/5  ?  ? ?REFLEXES: ?Hyperreflexia of bilateral upper and lower extremity, more on the right side, downgoing toes bilaterally ? ?SENSORY: ?Intact to light touch, pinprick and vibratory sensation are intact in fingers and toes. ? ?COORDINATION: ?There is no trunk or limb dysmetria noted. ? ?GAIT/STANCE: Need push-up to get up from seated position, unsteady, stiff, dragging right leg ? ? ?REVIEW OF SYSTEMS:  ?Full 14 system review of systems performed and notable only for as above ?All other review of systems were negative. ? ? ?ALLERGIES: ?No Known Allergies ? ?HOME MEDICATIONS: ?Current Outpatient Medications  ?Medication Sig Dispense Refill  ? cyclobenzaprine (FLEXERIL) 10 MG tablet Take 1 tablet by mouth 3 (three) times daily as needed.    ? ?No current facility-administered medications for this visit.  ? ? ?PAST MEDICAL HISTORY: ?Past Medical History:  ?Diagnosis Date  ? Chronic back pain   ? Right leg weakness   ? ? ?PAST SURGICAL HISTORY: ?Past Surgical History:  ?Procedure Laterality Date  ? No past surgery    ? ? ?FAMILY HISTORY: ?Family History  ?Problem Relation Age of Onset  ? Healthy Mother   ? Hypertension Father   ? Diabetes Maternal Grandmother   ? Diabetes Paternal Grandmother   ? ? ?SOCIAL HISTORY: ?Social History  ? ?Socioeconomic History  ? Marital status: Single  ?  Spouse name: Not on file  ? Number of children: 3  ? Years of education: 11th  ? Highest education level: Not on file  ?Occupational History  ? Occupation: Unemployed  ?Tobacco Use  ? Smoking status: Former  ? Smokeless tobacco: Never  ?Vaping Use  ? Vaping Use: Never used  ?Substance and Sexual Activity  ? Alcohol use: Yes  ?  Comment: social  ? Drug use: Yes  ?  Types: Marijuana  ?  Comment: daily  ? Sexual activity: Yes  ?Other Topics Concern  ? Not on file  ?Social History Narrative  ? Right-handed.  ? Caffeine use: drinks 16 ounce bottle of sodas per day (2-3)  ? Lives  with significant other.   ? ?Social Determinants of Health  ? ?Financial Resource Strain: Not on file  ?Food Insecurity: Not on file  ?Transportation Needs: Not on file  ?Physical Activity: Not on file  ?Stress: Not on file  ?Social Connections: Not on file  ?Intimate Partner Violence: Not on file  ? ? ? ? ?Marcial Pacas, M.D. Ph.D. ? ?[facility] ?[address] ?[place], [place] [address] ?Ph: [phone]) [phone] ?Fax: [phone] ? ?CC:  Truddie Hidden, MD ?[address] ?[place],  [place] [address]  Patient, No Pcp Per (Inactive)  ---------- ?

## 2022-02-26 NOTE — Telephone Encounter (Signed)
self pay order sent to [facility], [facility] they will reach out to the patient to schedule.  ?

## 2022-03-03 ENCOUNTER — Inpatient Hospital Stay: Admission: RE | Admit: 2022-03-03 | Payer: Medicaid Other | Source: Ambulatory Visit

## 2022-03-03 ENCOUNTER — Other Ambulatory Visit: Payer: Medicaid Other

## 2022-03-05 ENCOUNTER — Encounter: Payer: Self-pay | Admitting: *Deleted

## 2022-03-05 ENCOUNTER — Other Ambulatory Visit: Payer: Self-pay | Admitting: *Deleted

## 2022-03-09 ENCOUNTER — Other Ambulatory Visit: Payer: Medicaid Other

## 2022-03-24 ENCOUNTER — Encounter (HOSPITAL_BASED_OUTPATIENT_CLINIC_OR_DEPARTMENT_OTHER): Payer: Self-pay

## 2022-03-24 ENCOUNTER — Emergency Department (HOSPITAL_BASED_OUTPATIENT_CLINIC_OR_DEPARTMENT_OTHER): Payer: Medicaid Other

## 2022-03-24 ENCOUNTER — Emergency Department (HOSPITAL_BASED_OUTPATIENT_CLINIC_OR_DEPARTMENT_OTHER)
Admission: EM | Admit: 2022-03-24 | Discharge: 2022-03-25 | Disposition: A | Discharge status: Home / Self Care | Payer: Medicaid Other | Attending: Emergency Medicine | Admitting: Emergency Medicine

## 2022-03-24 ENCOUNTER — Other Ambulatory Visit: Payer: Self-pay

## 2022-03-24 DIAGNOSIS — M5000 Cervical disc disorder with myelopathy, unspecified cervical region: Secondary | ICD-10-CM | POA: Insufficient documentation

## 2022-03-24 DIAGNOSIS — G959 Disease of spinal cord, unspecified: Secondary | ICD-10-CM

## 2022-03-24 DIAGNOSIS — R531 Weakness: Secondary | ICD-10-CM | POA: Insufficient documentation

## 2022-03-24 DIAGNOSIS — G8929 Other chronic pain: Secondary | ICD-10-CM

## 2022-03-24 DIAGNOSIS — M5136 Other intervertebral disc degeneration, lumbar region: Secondary | ICD-10-CM | POA: Insufficient documentation

## 2022-03-24 MED ORDER — HYDROCODONE-ACETAMINOPHEN 5-325 MG PO TABS
2.0000 | ORAL_TABLET | Freq: Once | ORAL | Status: AC
  Administered 2022-03-24: 2 via ORAL
  Filled 2022-03-24: qty 2

## 2022-03-24 MED ORDER — KETOROLAC TROMETHAMINE 10 MG PO TABS: 10.0000 mg | ORAL_TABLET | Freq: Four times a day (QID) | ORAL | 0 refills | Status: DC | PRN

## 2022-03-24 MED ORDER — GABAPENTIN 300 MG PO CAPS: 300.0000 mg | ORAL_CAPSULE | Freq: Three times a day (TID) | ORAL | 0 refills | Status: DC

## 2022-03-24 MED ORDER — LIDOCAINE 5 % EX PTCH: 1.0000 | MEDICATED_PATCH | CUTANEOUS | 0 refills | Status: DC

## 2022-03-24 MED ORDER — CYCLOBENZAPRINE HCL 5 MG PO TABS
5.0000 mg | ORAL_TABLET | Freq: Once | ORAL | Status: AC
  Administered 2022-03-24: 5 mg via ORAL
  Filled 2022-03-24: qty 1

## 2022-03-24 MED ORDER — METHOCARBAMOL 500 MG PO TABS: 500.0000 mg | ORAL_TABLET | Freq: Two times a day (BID) | ORAL | 0 refills | Status: DC

## 2022-03-24 MED ORDER — LIDOCAINE 5 % EX PTCH
1.0000 | MEDICATED_PATCH | CUTANEOUS | Status: DC
  Administered 2022-03-24: 1 via TRANSDERMAL
  Filled 2022-03-24: qty 1

## 2022-03-24 MED ORDER — HYDROCODONE-ACETAMINOPHEN 5-325 MG PO TABS: 1.0000 | ORAL_TABLET | Freq: Four times a day (QID) | ORAL | 0 refills | Status: DC | PRN

## 2022-03-24 MED ORDER — KETOROLAC TROMETHAMINE 60 MG/2ML IM SOLN
60.0000 mg | Freq: Once | INTRAMUSCULAR | Status: AC
  Administered 2022-03-24: 60 mg via INTRAMUSCULAR
  Filled 2022-03-24: qty 2

## 2022-03-24 NOTE — Discharge Instructions (Addendum)
Your CT scan was without evidence of acute fracture or malalignment of the lumbar spine. It did show evidence of degenerative disc disease in your lumbar spine. You have no red flag symptoms concerning for a need for emergent MRI at this time. Return to the ED for emergent MRI imaging if your experiencing the following: Worsening numbness or weakness in the bilateral lower extremities, urinary incontinence, fecal incontinence, saddle numbness. Recommend outpatient follow-up with neurology for MRI imaging of the cervical and lumbar spine and brain MRI given your year long history of back pain and weakness.  We will treat with gabapentin 300 mg 3 times daily, Toradol tablets, lidocaine patch, Robaxin. A referral to neurosurgery has been placed. Recommend you establish care with a PCP to coordinate your care.

## 2022-03-24 NOTE — ED Triage Notes (Signed)
Arrives POV with c/o recurrent midline lower back pain. Pt sts pain returned 2 days ago. Pending appts with neurologist to evaluate back pain and nerve problems in right leg.   Taking naproxen with no improvement.

## 2022-03-24 NOTE — ED Provider Notes (Signed)
[facility] EMERGENCY DEPT Provider Note   CSN: [id number] Arrival date & time: 03/24/22  2053     History  Chief Complaint  Patient presents with   Back Pain    Vincent Stout is a 41 y.o. male.   Back Pain  41 year old male presenting to the emergency department with acute on chronic back pain.  The patient states that for the past year he has had right hemibody weakness.  He followed outpatient with neurology and was scheduled to have MRI of the brain and MRI imaging of the back due to concern for possible compressive myelopathy.  The patient states that he has had no trauma to his lower back.  He woke up on Friday with worsening symptoms of pain in the lower back with no radiculopathy.  He denies any new numbness or weakness in the right hemibody.  He denies any urinary incontinence or fecal incontinence. He denies any fevers or chills.  The patient states that he missed his outpatient appointment for MRI imaging but plans to follow-up with neurology to get that scheduled.  On chart review, the patient's neurology visit on 02/26/22 revealed the following concerns: Subacute onset of gait abnormality, right leg weakness, urinary hesitation, neck pain radiating pain to right arm, right low back pain radiating pain to right lower extremity since 2021             Hyperreflexia of both upper and lower extremity, right distal more than proximal leg weakness,             MRI of cervical spine to rule out cervical spondylitic myelopathy, MRI of lumbar spine to rule out superimposed right lumbar radiculopathy             Gabapentin 300 mg 3 times daily   Transient confusion, nonresponsiveness             Suspicious for partial seizure, MRI of the brain with without contrast             EEG,             Return to clinic in 1 month  Home Medications Prior to Admission medications   Medication Sig Start Date End Date Taking? Authorizing Provider  HYDROcodone-acetaminophen  (NORCO/VICODIN) 5-325 MG tablet Take 1-2 tablets by mouth every 6 (six) hours as needed. 03/24/22  Yes Ernie Avena, MD  gabapentin (NEURONTIN) 300 MG capsule Take 1 capsule (300 mg total) by mouth 3 (three) times daily. 03/24/22 04/23/22  Ernie Avena, MD  ketorolac (TORADOL) 10 MG tablet Take 1 tablet (10 mg total) by mouth every 6 (six) hours as needed. 03/24/22   Ernie Avena, MD  lidocaine (LIDODERM) 5 % Place 1 patch onto the skin daily. Remove & Discard patch within 12 hours or as directed by MD 03/24/22   Ernie Avena, MD  methocarbamol (ROBAXIN) 500 MG tablet Take 1 tablet (500 mg total) by mouth 2 (two) times daily. 03/24/22   Ernie Avena, MD      Allergies    Patient has no known allergies.    Review of Systems   Review of Systems  Musculoskeletal:  Positive for back pain.  All other systems reviewed and are negative.  Physical Exam Updated Vital Signs BP 131/90   Pulse 74   Temp 98.4 F (36.9 C) (Oral)   Resp 18   Ht  (1.803 m)   Wt 80.7 kg   SpO2 98%   BMI 24.83 kg/m  Physical Exam Vitals  and nursing note reviewed.  Constitutional:      General: He is not in acute distress.    Appearance: He is well-developed.  HENT:     Head: Normocephalic and atraumatic.  Eyes:     Conjunctiva/sclera: Conjunctivae normal.  Cardiovascular:     Rate and Rhythm: Normal rate and regular rhythm.  Pulmonary:     Effort: Pulmonary effort is normal. No respiratory distress.     Breath sounds: Normal breath sounds.  Abdominal:     Palpations: Abdomen is soft.     Tenderness: There is no abdominal tenderness.  Musculoskeletal:        General: No swelling.     Cervical back: Neck supple.  Skin:    General: Skin is warm and dry.     Capillary Refill: Capillary refill takes less than 2 seconds.  Neurological:     Mental Status: He is alert.     Comments: MENTAL STATUS EXAM:    Orientation: Alert and oriented to person, place and time.  Memory: Cooperative, follows  commands well.  Language: Speech is clear and language is normal.   CRANIAL NERVES:    CN 2 (Optic): Visual fields intact to confrontation.  CN 3,4,6 (EOM): Pupils equal and reactive to light. Full extraocular eye movement without nystagmus.  CN 5 (Trigeminal): Facial sensation is normal, no weakness of masticatory muscles.  CN 7 (Facial): No facial weakness or asymmetry.  CN 8 (Auditory): Auditory acuity grossly normal.  CN 9,10 (Glossophar): The uvula is midline, the palate elevates symmetrically.  CN 11 (spinal access): Normal sternocleidomastoid and trapezius strength.  CN 12 (Hypoglossal): The tongue is midline. No atrophy or fasciculations.Marland Kitchen   MOTOR:  Muscle Strength: 4/5RUE, 5/5LUE, 4/5RLE, 5/5LLE.   COORDINATION:   Intact finger-to-nose, no tremor.   SENSATION:   Intact to light touch all four extremities.  GAIT: Gait normal without ataxia   Psychiatric:        Mood and Affect: Mood normal.    ED Results / Procedures / Treatments   Labs (all labs ordered are listed, but only abnormal results are displayed) Labs Reviewed - No data to display  EKG None  Radiology CT Lumbar Spine Wo Contrast  Result Date: 03/24/2022 CLINICAL DATA:  Low back pain EXAM: CT LUMBAR SPINE WITHOUT CONTRAST TECHNIQUE: Multidetector CT imaging of the lumbar spine was performed without intravenous contrast administration. Multiplanar CT image reconstructions were also generated. RADIATION DOSE REDUCTION: This exam was performed according to the departmental dose-optimization program which includes automated exposure control, adjustment of the mA and/or kV according to patient size and/or use of iterative reconstruction technique. COMPARISON:  No prior CT of the lumbar spine, correlation is made with lumbar spine radiographs 08/21/2018 and CT renal stone 07/22/2016 FINDINGS: Segmentation: 5 lumbar type vertebrae. Alignment: No listhesis. Vertebrae: No acute fracture or suspicious osseous lesion.  Paraspinal and other soft tissues: No acute finding. Disc levels: L4-L5: Disc bulge with superimposed central protrusion, which causes mild spinal canal stenosis and narrows the left-greater-than-right lateral recesses, possibly affecting the descending L5 nerve roots. This appears unchanged from the 07/22/2016 CT. L5-S1: Right eccentric disc bulge with right foraminal and extreme lateral protrusion, which abuts the exiting right L5 nerve. This appears unchanged compared to the 2017 CT. No spinal canal stenosis. Mild right neural foraminal narrowing. IMPRESSION: 1. No acute fracture or traumatic listhesis. 2. L4-L5 mild spinal canal stenosis and narrowing of the lateral recesses, which appears unchanged from 2017. 3. L5-S1 mild right neural foraminal  narrowing. The right eccentric disc bulge abuts the exiting right L5 nerve. This level also appears unchanged compared to 2017. Electronically Signed   By: Wiliam KeAlison  Vasan M.D.   On: 03/24/2022 23:33    Procedures Procedures    Medications Ordered in ED Medications  HYDROcodone-acetaminophen (NORCO/VICODIN) 5-325 MG per tablet 2 tablet (2 tablets Oral Given 03/24/22 2241)  cyclobenzaprine (FLEXERIL) tablet 5 mg (5 mg Oral Given 03/24/22 2241)  ketorolac (TORADOL) injection 60 mg (60 mg Intramuscular Given 03/24/22 2243)    ED Course/ Medical Decision Making/ A&P                           Medical Decision Making Amount and/or Complexity of Data Reviewed Radiology: ordered.  Risk Prescription drug management.   41 year old male presenting to the emergency department with acute on chronic back pain.  The patient states that for the past year he has had right hemibody weakness.  He followed outpatient with neurology and was scheduled to have MRI of the brain and MRI imaging of the back due to concern for possible compressive myelopathy.  The patient states that he has had no trauma to his lower back.  He woke up on Friday with worsening symptoms of pain  in the lower back with no radiculopathy.  He denies any new numbness or weakness in the right hemibody.  He denies any urinary incontinence or fecal incontinence. He denies any fevers or chills.  The patient states that he missed his outpatient appointment for MRI imaging but plans to follow-up with neurology to get that scheduled.  On arrival, the patient was afebrile, hemodynamically stable, saturating well on room air.  Sinus rhythm noted on cardiac telemetry.  Physical exam concerning for right hemibody weakness that has been present for the past year.  Differential diagnosis includes compressive myelopathy of the cervical spine, old CVA.  The patient is followed outpatient for this with neurology and has plans for MRI imaging outpatient to further evaluate.  He endorses worsening back pain in his lower lumbar spine.  No recent falls or trauma.  CT imaging of the lumbar spine was obtained and revealed evidence of degenerative disc disease, no evidence of fracture.  Currently neurologically at his baseline.  No red flag symptoms for acute cauda equina and no worsening of neurologic deficits which have been chronic.  The patient was administered Norco, Flexeril, Toradol and a lidocaine patch for pain control.  I advised the patient and urged him strongly to follow-up for outpatient MRI imaging.  He currently does not have a PCP to coordinate all of his care.  I recommend that he follow-up with his neurologist and establish care with a PCP.  A referral was placed to spine surgery due to concern for potential compressive myelopathy in the cervical spine with additional concern for degenerative disc disease with chronic low back pain.  Provided strict return precautions in the event of worsening signs or symptoms of cauda equina requiring emergent MRI imaging.  DC Instructions: Your CT scan was without evidence of acute fracture or malalignment of the lumbar spine. It did show evidence of degenerative disc  disease in your lumbar spine. You have no red flag symptoms concerning for a need for emergent MRI at this time. Return to the ED for emergent MRI imaging if your experiencing the following: Worsening numbness or weakness in the bilateral lower extremities, urinary incontinence, fecal incontinence, saddle numbness. Recommend outpatient follow-up with neurology for  MRI imaging of the cervical and lumbar spine and brain MRI given your year long history of back pain and weakness.  We will treat with gabapentin 300 mg 3 times daily, Toradol tablets, lidocaine patch, Robaxin. A referral to neurosurgery has been placed. Recommend you establish care with a PCP to coordinate your care.   Final Clinical Impression(s) / ED Diagnoses Final diagnoses:  Chronic midline low back pain without sciatica  Weakness  Cervical myelopathy (HCC)  Degenerative disc disease, lumbar    Rx / DC Orders ED Discharge Orders          Ordered    gabapentin (NEURONTIN) 300 MG capsule  3 times daily,   Status:  Discontinued        03/24/22 2326    ketorolac (TORADOL) 10 MG tablet  Every 6 hours PRN,   Status:  Discontinued        03/24/22 2326    lidocaine (LIDODERM) 5 %  Every 24 hours,   Status:  Discontinued        03/24/22 2326    methocarbamol (ROBAXIN) 500 MG tablet  2 times daily,   Status:  Discontinued        03/24/22 2326    Ambulatory referral to Spine Surgery        03/24/22 2353    gabapentin (NEURONTIN) 300 MG capsule  3 times daily        03/24/22 2355    ketorolac (TORADOL) 10 MG tablet  Every 6 hours PRN        03/24/22 2355    lidocaine (LIDODERM) 5 %  Every 24 hours        03/24/22 2355    methocarbamol (ROBAXIN) 500 MG tablet  2 times daily        03/24/22 2355    HYDROcodone-acetaminophen (NORCO/VICODIN) 5-325 MG tablet  Every 6 hours PRN        03/24/22 2355              Ernie Avena, MD 03/25/22 2332

## 2022-03-25 NOTE — ED Notes (Signed)
Pt verbalizes understanding of discharge instructions. Opportunity for questioning and answers were provided. Pt discharged from ED to home with significant other.   ? ?

## 2022-03-26 ENCOUNTER — Ambulatory Visit: Payer: Self-pay | Admitting: Neurology

## 2022-03-26 ENCOUNTER — Encounter: Payer: Self-pay | Admitting: Neurology

## 2022-04-01 ENCOUNTER — Ambulatory Visit: Payer: Self-pay | Admitting: *Deleted

## 2022-04-07 ENCOUNTER — Inpatient Hospital Stay: Admission: RE | Admit: 2022-04-07 | Payer: Medicaid Other | Source: Ambulatory Visit

## 2022-04-08 ENCOUNTER — Other Ambulatory Visit: Payer: Medicaid Other | Admitting: *Deleted

## 2022-04-09 ENCOUNTER — Other Ambulatory Visit: Payer: Medicaid Other

## 2022-04-10 ENCOUNTER — Other Ambulatory Visit: Payer: Medicaid Other | Admitting: *Deleted

## 2022-04-15 ENCOUNTER — Ambulatory Visit (INDEPENDENT_AMBULATORY_CARE_PROVIDER_SITE_OTHER): Payer: Self-pay | Admitting: Neurology

## 2022-04-15 DIAGNOSIS — R29898 Other symptoms and signs involving the musculoskeletal system: Secondary | ICD-10-CM

## 2022-04-15 DIAGNOSIS — R269 Unspecified abnormalities of gait and mobility: Secondary | ICD-10-CM

## 2022-04-15 DIAGNOSIS — R410 Disorientation, unspecified: Secondary | ICD-10-CM

## 2022-04-15 DIAGNOSIS — M542 Cervicalgia: Secondary | ICD-10-CM

## 2022-04-16 NOTE — Procedures (Signed)
   HISTORY: 41 years old male presenting with transient confusion, unresponsiveness  TECHNIQUE:  This is a routine 16 channel EEG recording with one channel devoted to a limited EKG recording.  It was performed during wakefulness, drowsiness and asleep.  Hyperventilation and photic stimulation were performed as activating procedures.  There are minimum muscle and movement artifact noted.  Upon maximum arousal, posterior dominant waking rhythm consistent of low amplitude, rhythmic alpha range activity, Activities are symmetric over the bilateral posterior derivations and attenuated with eye opening.  Hyperventilation produced mild/moderate buildup with higher amplitude and the slower activities noted.  Photic stimulation did not alter the tracing.  During EEG recording, patient developed drowsiness and no deeper stage of sleep was achieved   During EEG recording, there was no epileptiform discharge noted.  EKG demonstrate sinus rhythm, with heart rate of 60 bpm  CONCLUSION: This is a  normal awake EEG.  There is no electrodiagnostic evidence of epileptiform discharge.  Levert Feinstein, M.D. Ph.D.  [facility] [address] [place], [place] [address] Phone: [phone] Fax:      [phone]

## 2022-04-22 ENCOUNTER — Telehealth: Payer: Self-pay | Admitting: Neurology

## 2022-04-27 ENCOUNTER — Ambulatory Visit
Admission: RE | Admit: 2022-04-27 | Discharge: 2022-04-27 | Disposition: A | Discharge status: Home / Self Care | Payer: Medicaid Other | Source: Ambulatory Visit | Attending: Neurology | Admitting: Neurology

## 2022-04-27 DIAGNOSIS — R410 Disorientation, unspecified: Secondary | ICD-10-CM

## 2022-04-27 DIAGNOSIS — R29898 Other symptoms and signs involving the musculoskeletal system: Secondary | ICD-10-CM

## 2022-04-27 DIAGNOSIS — M542 Cervicalgia: Secondary | ICD-10-CM

## 2022-04-27 DIAGNOSIS — R269 Unspecified abnormalities of gait and mobility: Secondary | ICD-10-CM

## 2022-04-27 MED ORDER — GADOBENATE DIMEGLUMINE 529 MG/ML IV SOLN
15.0000 mL | Freq: Once | INTRAVENOUS | Status: AC | PRN
  Administered 2022-04-27: 15 mL via INTRAVENOUS

## 2022-05-01 ENCOUNTER — Telehealth: Payer: Self-pay | Admitting: Neurology

## 2022-05-01 DIAGNOSIS — M48061 Spinal stenosis, lumbar region without neurogenic claudication: Secondary | ICD-10-CM

## 2022-05-01 DIAGNOSIS — G959 Disease of spinal cord, unspecified: Secondary | ICD-10-CM | POA: Insufficient documentation

## 2022-05-01 DIAGNOSIS — R269 Unspecified abnormalities of gait and mobility: Secondary | ICD-10-CM

## 2022-05-01 NOTE — Telephone Encounter (Signed)
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   At L4-L5, there is a large left paramedian disc extrusion filling the left lateral recess and causing moderately severe spinal stenosis and severe left lateral recess stenosis and moderate left foraminal narrowing.  There is probable left L5 nerve root compression.  Endplate edema with mild enhancement is also noted. 2.   At L5-S1, there is a right foraminal disc protrusion and other degenerative changes causing moderately severe right foraminal narrowing and moderate right greater than left lateral recess stenosis.  There is probable right L5 nerve root compression and some encroachment upon the right S1 nerve root. 3.   Milder degenerative changes at L2-L3 and L3-L4 did not lead to spinal stenosis or nerve root compression.   IMPRESSION: This MRI of the cervical spine with and without contrast shows the following: 1.   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 2.   At C3-C4, there is mild spinal stenosis and moderate left foraminal narrowing but no nerve root compression. 3.   At C4-C5, there is moderate spinal stenosis causing moderately severe left and moderate right foraminal narrowing with potential for left C5 nerve root compression. 4.   At C5-C6, there is very severe spinal stenosis (AP diameter 4.7 mm) due to a central disc herniation and other degenerative changes.  Additionally there is moderately severe right and moderate left foraminal narrowing with potential for right C6 nerve root  compression. 5.   C6-C7, there is mild spinal stenosis and mild to moderate bilateral foraminal narrowing but no nerve root compression. 6.   Normal enhancement pattern.    IMPRESSION: This MRI of the brain with and without contrast shows the following: 1.   Few punctate T2/FLAIR hyperintense foci in the subcortical or deep white matter of the cerebral hemispheres.  This is a nonspecific finding and most likely represents minimal chronic microvascular ischemic change or the sequela of migraine.  None of the foci appear to be acute.  They do not enhance. 2.   Normal enhancement pattern.  No acute findings.

## 2022-05-02 ENCOUNTER — Encounter: Payer: Self-pay | Admitting: *Deleted

## 2022-05-02 NOTE — Telephone Encounter (Signed)
I have attempted to reach the patient three different times, between last evening and this morning. No answer and his voicemail is not set up. Also, sent mychart message.  Additionally, I have called his mother [place] and friend [place]. Left messages asking them to have the patient call me back.

## 2022-05-02 NOTE — Telephone Encounter (Signed)
I was able to connect with the patient. Reviewed his MRI results and he verbalized understanding. He is agreeable to proceed with the neurosurgery referral. Aware to expect a call from that office for scheduling.

## 2022-05-07 ENCOUNTER — Encounter: Payer: Self-pay | Admitting: Neurology

## 2022-05-07 ENCOUNTER — Ambulatory Visit (INDEPENDENT_AMBULATORY_CARE_PROVIDER_SITE_OTHER): Payer: Self-pay | Admitting: Neurology

## 2022-05-07 VITALS — BP 116/81 | HR 73 | Ht 71.0 in | Wt 182.5 lb

## 2022-05-07 DIAGNOSIS — R269 Unspecified abnormalities of gait and mobility: Secondary | ICD-10-CM

## 2022-05-07 DIAGNOSIS — M48061 Spinal stenosis, lumbar region without neurogenic claudication: Secondary | ICD-10-CM

## 2022-05-07 DIAGNOSIS — G959 Disease of spinal cord, unspecified: Secondary | ICD-10-CM

## 2022-05-07 MED ORDER — GABAPENTIN 300 MG PO CAPS
900.0000 mg | ORAL_CAPSULE | Freq: Three times a day (TID) | ORAL | 6 refills | Status: AC
Start: 2022-05-07 — End: 2023-10-06

## 2022-05-07 MED ORDER — MELOXICAM 15 MG PO TABS: 15.0000 mg | ORAL_TABLET | Freq: Every day | ORAL | 6 refills | Status: DC | PRN

## 2022-05-07 NOTE — Progress Notes (Addendum)
Chief Complaint  Patient presents with   Follow-up    Rm 15. Accompanied by children. F/u MRI.       ASSESSMENT AND PLAN  Vincent Stout is a 41 y.o. male   Subacute onset of gait abnormality, limb weakness, urinary hesitation since 2021, neck pain, low back pain  MRI cervical spine showed severe spinal stenosis at C5-6, moderate C4-5, with cord signal abnormality,   MRI of lumbar spine moderately severe spinal stenosis L4-5, severe right foraminal stenosis L5-S1  Referred to neurosurgeon for decompression surgery of cervical and lumbar spine  Gabapentin higher dose 300 mg up to 3 tablets 3 times a day, with Mobic 15 mg daily as needed  DIAGNOSTIC DATA (LABS, IMAGING, TESTING) - I reviewed patient records, labs, notes, testing and imaging myself where available. Addendum: Neurosurgical evaluation by Dr. 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 Pollyann Savoy, initial evaluation was on Feb 26, 2022  I reviewed and summarized the referring note. PMHX. He smokes marijuana regularly  He reported a history of incarceration in 2016, following that, he noticed difficulty initiate urine, gradually getting worse, he worked as a Corporate investment banker, last time he went to work was in 2021  In 2021, he developed gait abnormality, initially he describes significant right-sided low back pain, radiating pain to right lower extremity, difficulty lifting his right leg up, later on also developed right leg uncontrollable jerking movement,  Now he also complains of neck pain radiating pain to right shoulder, he reported history of right fifth finger injury as a child punching accident, already has some deformity of right fifth finger, his right hand is  weaker, difficult to make a tight grip,  He is tearful at today's visit, " does not want to live like this", after his emotional outburst, he suddenly went silent, I witnessed that he had staring, decreased facial expression, mild confusion, lasting less than 1 minute, then gradually came back to his normal self,  Update May 07, 2022: He returns to review his MRI findings, continue to have significant gait abnormality, arm and leg weakness, used to work at [employer] work, played basketball in the past, denies significant injury,  MRI of cervical spine severe spinal stenosis C5-6, myelopathic signal within the cord, moderate spinal stenosis C4-5,  MRI of lumbar moderately severe spinal stenosis L4-5, probable left L5 nerve root compression, L5-S1 right foraminal disc protrusion, moderately severe right foraminal narrowing  MRI of the brain showed no significant abnormality  PHYSICAL EXAM:   Vitals:   05/07/22 1551  BP: 116/81  Pulse: 73  Weight: 182 lb 8 oz (82.8 kg)  Height: 5\' 11"  (1.803 m)   Not recorded     Body mass index is 25.45 kg/m.  PHYSICAL EXAMNIATION:  Gen: NAD, conversant, well nourised, well groomed                     Cardiovascular: Regular rate rhythm, no peripheral edema, warm, nontender. Eyes: Conjunctivae clear without exudates or hemorrhage Neck: Supple, no carotid bruits. Pulmonary: Clear to auscultation bilaterally   NEUROLOGICAL EXAM:  MENTAL STATUS: Speech/Cognition:    Awake, alert, oriented to history taking and casual conversation, poor historian CRANIAL NERVES: CN II: Visual fields are full to confrontation. Pupils are round equal  and briskly reactive to light. CN III, IV, VI: extraocular movement are normal. No ptosis. CN V: Facial sensation is intact to light touch CN VII: Face is symmetric with normal eye closure  CN VIII: Hearing is normal to causal conversation. CN IX, X: Phonation is normal. CN XI: Head turning and shoulder shrug  are intact  MOTOR:    UE Shoulder Abduction Shoulder External Rotation Elbow Flexion Wrist Flexion Wrist Extension Grip Finger  Abduction  R 5- 5- 5 5 5  5- 5-  L 4 4 4 5 5 5 5    LE Hip Flexion Knee flexion Knee extension Ankle Dorsiflexion Ankle plantar Flexion  R 4 4 4 3 4   L 4 5 5 4 4      REFLEXES: Hyperreflexia of bilateral upper and lower extremity, more on the right side, bilateral plantar responses were extensor  SENSORY: Intact to light touch, pinprick and vibratory sensation are intact in fingers and toes.  COORDINATION: There is no trunk or limb dysmetria noted.  GAIT/STANCE: Need push-up to get up from seated position, unsteady, stiff, dragging right leg   REVIEW OF SYSTEMS:  Full 14 system review of systems performed and notable only for as above All other review of systems were negative.   ALLERGIES: No Known Allergies  HOME MEDICATIONS: Current Outpatient Medications  Medication Sig Dispense Refill   gabapentin (NEURONTIN) 300 MG capsule Take 1 capsule (300 mg total) by mouth 3 (three) times daily. 90 capsule 0   HYDROcodone-acetaminophen (NORCO/VICODIN) 5-325 MG tablet Take 1-2 tablets by mouth every 6 (six) hours as needed. 12 tablet 0   ketorolac (TORADOL) 10 MG tablet Take 1 tablet (10 mg total) by mouth every 6 (six) hours as needed. 20 tablet 0   lidocaine (LIDODERM) 5 % Place 1 patch onto the skin daily. Remove & Discard patch within 12 hours or as directed by MD 30 patch 0   methocarbamol (ROBAXIN) 500 MG tablet Take 1 tablet (500 mg total) by mouth 2 (two) times daily. 20 tablet 0   No current facility-administered medications for this visit.    PAST MEDICAL HISTORY: Past Medical History:  Diagnosis Date   Chronic back pain    Right leg weakness     PAST SURGICAL HISTORY: Past Surgical History:  Procedure Laterality Date   No past surgery      FAMILY HISTORY: Family History  Problem Relation Age of Onset   Healthy Mother     Hypertension Father    Diabetes Maternal Grandmother    Diabetes Paternal Grandmother     SOCIAL HISTORY: Social History   Socioeconomic History   Marital status: Single    Spouse name: Not on file   Number of children: 3   Years of education: 11th   Highest education level: Not on file  Occupational History   Occupation: Unemployed  Tobacco Use   Smoking status: Former   Smokeless tobacco: Never  Scientific laboratory technician Use: Never used  Substance and Sexual Activity   Alcohol use: Yes    Comment: social   Drug use: Yes    Types: Marijuana    Comment: daily   Sexual activity: Yes  Other Topics Concern   Not on file  Social History Narrative   Right-handed.   Caffeine use: drinks 16 ounce bottle of sodas per day (2-3)   Lives with significant other.    Social Determinants of Health   Financial Resource Strain: Not on file  Food Insecurity: Not on  file  Transportation Needs: Not on file  Physical Activity: Not on file  Stress: Not on file  Social Connections: Not on file  Intimate Partner Violence: Not on file      Levert Feinstein, M.D. Ph.D.  [facility] [address], [address] [place], [place] [address] Ph: [phone] Fax: [phone]  CC:  No referring provider defined for this encounter.  Patient, No Pcp Per  ----------

## 2022-05-07 NOTE — Patient Instructions (Signed)
 Neurosurgical Clinic  [address] [place], [place] [address]  PHONE [phone]

## 2022-05-08 ENCOUNTER — Telehealth: Payer: Self-pay | Admitting: Neurology

## 2022-05-08 NOTE — Telephone Encounter (Signed)
Referral for Neurosurgery sent to [facility] [phone]. 

## 2022-05-16 NOTE — Telephone Encounter (Signed)
Pt is scheduled on 06/03/2022 @ 9:00 AM with Dr. Franky Macho.

## 2022-10-29 DIAGNOSIS — M5126 Other intervertebral disc displacement, lumbar region: Secondary | ICD-10-CM | POA: Diagnosis not present

## 2022-10-29 DIAGNOSIS — Z6825 Body mass index (BMI) 25.0-25.9, adult: Secondary | ICD-10-CM | POA: Diagnosis not present

## 2022-10-29 DIAGNOSIS — M4712 Other spondylosis with myelopathy, cervical region: Secondary | ICD-10-CM | POA: Diagnosis not present

## 2022-12-03 ENCOUNTER — Other Ambulatory Visit: Payer: Self-pay | Admitting: Neurosurgery

## 2022-12-09 DIAGNOSIS — Z6826 Body mass index (BMI) 26.0-26.9, adult: Secondary | ICD-10-CM | POA: Diagnosis not present

## 2022-12-09 DIAGNOSIS — M4712 Other spondylosis with myelopathy, cervical region: Secondary | ICD-10-CM | POA: Diagnosis not present

## 2022-12-12 NOTE — Progress Notes (Signed)
Surgical Instructions    Your procedure is scheduled on Friday, February 23.  Report to [facility] Main Entrance "A" at 5:30 A.M., then check in with the Admitting office.  Call this number if you have problems the morning of surgery:  [phone]   If you have any questions prior to your surgery date call [phone]: Open Monday-Friday 8am-4pm If you experience any cold or flu symptoms such as cough, fever, chills, shortness of breath, etc. between now and your scheduled surgery, please notify us at the above number     Remember:  Do not eat after midnight the night before your surgery  You may drink clear liquids until 4:30AM the morning of your surgery.   Clear liquids allowed are: Water, Non-Citrus Juices (without pulp), Carbonated Beverages, Clear Tea, Black Coffee ONLY (NO MILK, CREAM OR POWDERED CREAMER of any kind), and Gatorade    Take these medicines the morning of surgery with A SIP OF WATER:   IF NEEDED: gabapentin (NEURONTIN)  HYDROcodone-acetaminophen (NORCO/VICODIN)    As of today, STOP taking any Aspirin (unless otherwise instructed by your surgeon), meloxicam (Mobic), Aleve, Naproxen, Ibuprofen, Motrin, Advil, Goody's, BC's, all herbal medications, fish oil, and all vitamins.   [facility] is not responsible for any belongings or valuables.    Do NOT Smoke (Tobacco/Vaping)  24 hours prior to your procedure  If you use a CPAP at night, you may bring your mask for your overnight stay.   Contacts, glasses, hearing aids, dentures or partials may not be worn into surgery, please bring cases for these belongings   For patients admitted to the hospital, discharge time will be determined by your treatment team.   Patients discharged the day of surgery will not be allowed to drive home, and someone needs to stay with them for 24 hours.   SURGICAL WAITING ROOM VISITATION Patients having surgery or a procedure may have no more than 2 support people in the waiting  area - these visitors may rotate.   Children under the age of 67 must have an adult with them who is not the patient. If the patient needs to stay at the hospital during part of their recovery, the visitor guidelines for inpatient rooms apply. Pre-op nurse will coordinate an appropriate time for 1 support person to accompany patient in pre-op.  This support person may not rotate.   Please refer to [email or web address] for the visitor guidelines for Inpatients (after your surgery is over and you are in a regular room).    Special instructions:    Oral Hygiene is also important to reduce your risk of infection.  Remember - BRUSH YOUR TEETH THE MORNING OF SURGERY WITH YOUR REGULAR TOOTHPASTE   [facility]- Preparing For Surgery  Before surgery, you can play an important role. Because skin is not sterile, your skin needs to be as free of germs as possible. You can reduce the number of germs on your skin by washing with CHG (chlorahexidine gluconate) Soap before surgery.  CHG is an antiseptic cleaner which kills germs and bonds with the skin to continue killing germs even after washing.     Please do not use if you have an allergy to CHG or antibacterial soaps. If your skin becomes reddened/irritated stop using the CHG.  Do not shave (including legs and underarms) for at least 48 hours prior to first CHG shower. It is OK to shave your face.  Please follow these instructions carefully.     Shower the [employer]  BEFORE SURGERY and the MORNING OF SURGERY with CHG Soap.   If you chose to wash your hair, wash your hair first as usual with your normal shampoo. After you shampoo, rinse your hair and body thoroughly to remove the shampoo.  Then [employer] and genitals (private parts) with your normal soap and rinse thoroughly to remove soap.  After that Use CHG Soap as you would any other liquid soap. You can apply CHG directly to the skin and wash gently with  a scrungie or a clean washcloth.   Apply the CHG Soap to your body ONLY FROM THE NECK DOWN.  Do not use on open wounds or open sores. Avoid contact with your eyes, ears, mouth and genitals (private parts). Wash Face and genitals (private parts)  with your normal soap.   Wash thoroughly, paying special attention to the area where your surgery will be performed.  Thoroughly rinse your body with warm water from the neck down.  DO NOT shower/wash with your normal soap after using and rinsing off the CHG Soap.  Pat yourself dry with a CLEAN TOWEL.  Wear CLEAN PAJAMAS to bed the night before surgery  Place CLEAN SHEETS on your bed the night before your surgery  DO NOT SLEEP WITH PETS.   Day of Surgery:  Take a shower with CHG soap. Wear Clean/Comfortable clothing the morning of surgery Do not wear jewelry or makeup. Do not wear lotions, powders, perfumes/cologne or deodorant. Do not shave 48 hours prior to surgery.  Men may shave face and neck. Do not bring valuables to the hospital. Do not wear nail polish, gel polish, artificial nails, or any other type of covering on natural nails (fingers and toes) If you have artificial nails or gel coating that need to be removed by a nail salon, please have this removed prior to surgery. Artificial nails or gel coating may interfere with anesthesia's ability to adequately monitor your vital signs. Remember to brush your teeth WITH YOUR REGULAR TOOTHPASTE.    If you received a COVID test during your pre-op visit, it is requested that you wear a mask when out in public, stay away from anyone that may not be feeling well, and notify your surgeon if you develop symptoms. If you have been in contact with anyone that has tested positive in the last 10 days, please notify your surgeon.    Please read over the following fact sheets that you were given.

## 2022-12-13 ENCOUNTER — Inpatient Hospital Stay (HOSPITAL_COMMUNITY)
Admission: RE | Admit: 2022-12-13 | Discharge: 2022-12-13 | Disposition: A | Discharge status: Home / Self Care | Payer: Medicaid Other | Source: Ambulatory Visit

## 2022-12-13 NOTE — Progress Notes (Signed)
Wanda and left message for patient regarding their appt this am at 0800.  Asked for a call back to be sure he was still coming to his appt.

## 2022-12-19 ENCOUNTER — Encounter (HOSPITAL_COMMUNITY): Payer: Self-pay | Admitting: Neurosurgery

## 2022-12-19 ENCOUNTER — Other Ambulatory Visit: Payer: Self-pay

## 2022-12-19 ENCOUNTER — Encounter (HOSPITAL_COMMUNITY): Payer: Self-pay | Admitting: Anesthesiology

## 2022-12-19 NOTE — Progress Notes (Addendum)
Vincent Stout denies chest pain or shortness of breath.  Patient denies having any s/s of Covid in his household, also denies any known exposure to Covid.   Vincent Stout does not have a PCP. I instructed patient to hold Meloxicam.  Vincent Stout reports that he has a toothache, it has been hurting for several, it feels like it might need to be pulled. Patient reports that the pain is a 5 out of 10. I notified Karoline Caldwell, PA- C and I left a message on [facility]. Dr. Lacy Duverney scheduler, reporting the tooth ache.   Vincent Stout reports that he has developed a rash on his right leg. Patient said it is not a blister, it itches and when he scratches it bleeds.  I asked patient to not scratch it.

## 2022-12-20 ENCOUNTER — Encounter (HOSPITAL_COMMUNITY)
Admission: RE | Disposition: A | Discharge status: Home / Self Care | Payer: Self-pay | Source: Home / Self Care | Attending: Neurosurgery

## 2022-12-20 ENCOUNTER — Ambulatory Visit (HOSPITAL_COMMUNITY): Payer: Medicaid Other

## 2022-12-20 ENCOUNTER — Ambulatory Visit (HOSPITAL_COMMUNITY)
Admission: RE | Admit: 2022-12-20 | Discharge: 2022-12-20 | Disposition: A | Discharge status: Home / Self Care | Payer: Medicaid Other | Attending: Neurosurgery | Admitting: Neurosurgery

## 2022-12-20 DIAGNOSIS — M4712 Other spondylosis with myelopathy, cervical region: Secondary | ICD-10-CM | POA: Diagnosis not present

## 2022-12-20 DIAGNOSIS — Z538 Procedure and treatment not carried out for other reasons: Secondary | ICD-10-CM | POA: Insufficient documentation

## 2022-12-20 HISTORY — DX: Pneumonia, unspecified organism: J18.9

## 2022-12-20 HISTORY — DX: Paralytic syndrome, unspecified: G83.9

## 2022-12-20 LAB — CBC
HCT: 44.5 % (ref 39.0–52.0)
Hemoglobin: 15.4 g/dL (ref 13.0–17.0)
MCH: 30.9 pg (ref 26.0–34.0)
MCHC: 34.6 g/dL (ref 30.0–36.0)
MCV: 89.2 fL (ref 80.0–100.0)
Platelets: 218 10*3/uL (ref 150–400)
RBC: 4.99 MIL/uL (ref 4.22–5.81)
RDW: 13.5 % (ref 11.5–15.5)
WBC: 10.1 10*3/uL (ref 4.0–10.5)
nRBC: 0 % (ref 0.0–0.2)

## 2022-12-20 LAB — ABO/RH: ABO/RH(D): O POS

## 2022-12-20 LAB — TYPE AND SCREEN
ABO/RH(D): O POS
Antibody Screen: NEGATIVE

## 2022-12-20 LAB — SURGICAL PCR SCREEN
MRSA, PCR: NEGATIVE
Staphylococcus aureus: POSITIVE — AB

## 2022-12-20 SURGERY — ANTERIOR CERVICAL DECOMPRESSION/DISCECTOMY FUSION 1 LEVEL
Anesthesia: General

## 2022-12-20 MED ORDER — LIDOCAINE-EPINEPHRINE 0.5 %-1:200000 IJ SOLN
INTRAMUSCULAR | Status: AC
  Filled 2022-12-20: qty 50

## 2022-12-20 MED ORDER — MIDAZOLAM HCL 2 MG/2ML IJ SOLN
INTRAMUSCULAR | Status: AC
  Filled 2022-12-20: qty 2

## 2022-12-20 MED ORDER — CEFAZOLIN SODIUM-DEXTROSE 2-4 GM/100ML-% IV SOLN: 2.0000 g | INTRAVENOUS | Status: DC

## 2022-12-20 MED ORDER — CHLORHEXIDINE GLUCONATE CLOTH 2 % EX PADS: 6.0000 | MEDICATED_PAD | Freq: Once | CUTANEOUS | Status: DC

## 2022-12-20 MED ORDER — ROCURONIUM BROMIDE 10 MG/ML (PF) SYRINGE
PREFILLED_SYRINGE | INTRAVENOUS | Status: AC
  Filled 2022-12-20: qty 10

## 2022-12-20 MED ORDER — PROPOFOL 10 MG/ML IV BOLUS
INTRAVENOUS | Status: AC
  Filled 2022-12-20: qty 20

## 2022-12-20 MED ORDER — LIDOCAINE 2% (20 MG/ML) 5 ML SYRINGE
INTRAMUSCULAR | Status: AC
  Filled 2022-12-20: qty 5

## 2022-12-20 MED ORDER — ORAL CARE MOUTH RINSE: 15.0000 mL | Freq: Once | OROMUCOSAL | Status: AC

## 2022-12-20 MED ORDER — PHENYLEPHRINE 80 MCG/ML (10ML) SYRINGE FOR IV PUSH (FOR BLOOD PRESSURE SUPPORT)
PREFILLED_SYRINGE | INTRAVENOUS | Status: AC
  Filled 2022-12-20: qty 10

## 2022-12-20 MED ORDER — FENTANYL CITRATE (PF) 250 MCG/5ML IJ SOLN
INTRAMUSCULAR | Status: AC
  Filled 2022-12-20: qty 5

## 2022-12-20 MED ORDER — LACTATED RINGERS IV SOLN: INTRAVENOUS | Status: DC

## 2022-12-20 MED ORDER — CHLORHEXIDINE GLUCONATE 0.12 % MT SOLN
15.0000 mL | Freq: Once | OROMUCOSAL | Status: AC
  Administered 2022-12-20: 15 mL via OROMUCOSAL

## 2022-12-20 MED ORDER — DEXAMETHASONE SODIUM PHOSPHATE 10 MG/ML IJ SOLN
INTRAMUSCULAR | Status: AC
  Filled 2022-12-20: qty 1

## 2022-12-20 MED ORDER — THROMBIN 5000 UNITS EX SOLR
CUTANEOUS | Status: AC
  Filled 2022-12-20: qty 10000

## 2022-12-20 MED ORDER — DEXMEDETOMIDINE HCL IN NACL 80 MCG/20ML IV SOLN
INTRAVENOUS | Status: AC
  Filled 2022-12-20: qty 20

## 2022-12-20 MED ORDER — ONDANSETRON HCL 4 MG/2ML IJ SOLN
INTRAMUSCULAR | Status: AC
  Filled 2022-12-20: qty 2

## 2022-12-20 NOTE — Anesthesia Preprocedure Evaluation (Deleted)
Anesthesia Evaluation  Patient identified by MRN, date of birth, ID band Patient awake    Reviewed: Allergy & Precautions, H&P , NPO status , Patient's Chart, lab work & pertinent test results  Airway Mallampati: II   Neck ROM: full    Dental   Pulmonary former smoker   breath sounds clear to auscultation       Cardiovascular negative cardio ROS  Rhythm:regular Rate:Normal     Neuro/Psych Right leg weakness    GI/Hepatic   Endo/Other    Renal/GU      Musculoskeletal Chronic back pain   Abdominal   Peds  Hematology   Anesthesia Other Findings   Reproductive/Obstetrics                             Anesthesia Physical Anesthesia Plan  ASA: 2  Anesthesia Plan: General   Post-op Pain Management:    Induction: Intravenous  PONV Risk Score and Plan: 2 and Ondansetron, Dexamethasone, Midazolam and Treatment may vary due to age or medical condition  Airway Management Planned: Oral ETT and Video Laryngoscope Planned  Additional Equipment:   Intra-op Plan:   Post-operative Plan: Extubation in OR  Informed Consent: I have reviewed the patients History and Physical, chart, labs and discussed the procedure including the risks, benefits and alternatives for the proposed anesthesia with the patient or authorized representative who has indicated his/her understanding and acceptance.     Dental advisory given  Plan Discussed with: CRNA, Anesthesiologist and Surgeon  Anesthesia Plan Comments:        Anesthesia Quick Evaluation

## 2022-12-20 NOTE — Progress Notes (Signed)
Patient stated he had chest tightness on the R side.  Dr. Marcie Bal aware and EKG done.  Dr. Marcie Bal also aware of b/p elevation.

## 2022-12-20 NOTE — Progress Notes (Signed)
Patient's case cancelled by Dr. Christella Noa d/t tooth ache.    Will remove IV and take patient out to the lobby with his friend who is at bedside.

## 2022-12-22 ENCOUNTER — Other Ambulatory Visit: Payer: Self-pay

## 2022-12-22 ENCOUNTER — Encounter (HOSPITAL_BASED_OUTPATIENT_CLINIC_OR_DEPARTMENT_OTHER): Payer: Self-pay | Admitting: Emergency Medicine

## 2022-12-22 ENCOUNTER — Emergency Department (HOSPITAL_BASED_OUTPATIENT_CLINIC_OR_DEPARTMENT_OTHER)
Admission: EM | Admit: 2022-12-22 | Discharge: 2022-12-22 | Disposition: A | Discharge status: Home / Self Care | Payer: Medicaid Other | Attending: Emergency Medicine | Admitting: Emergency Medicine

## 2022-12-22 DIAGNOSIS — R21 Rash and other nonspecific skin eruption: Secondary | ICD-10-CM | POA: Diagnosis present

## 2022-12-22 DIAGNOSIS — B354 Tinea corporis: Secondary | ICD-10-CM | POA: Insufficient documentation

## 2022-12-22 DIAGNOSIS — K0889 Other specified disorders of teeth and supporting structures: Secondary | ICD-10-CM | POA: Insufficient documentation

## 2022-12-22 MED ORDER — IBUPROFEN 800 MG PO TABS: 800.0000 mg | ORAL_TABLET | Freq: Three times a day (TID) | ORAL | 0 refills | Status: DC | PRN

## 2022-12-22 MED ORDER — CLOTRIMAZOLE-BETAMETHASONE 1-0.05 % EX CREA: 1.0000 | TOPICAL_CREAM | Freq: Two times a day (BID) | CUTANEOUS | 0 refills | Status: AC

## 2022-12-22 MED ORDER — PENICILLIN V POTASSIUM 500 MG PO TABS: 500.0000 mg | ORAL_TABLET | Freq: Four times a day (QID) | ORAL | 0 refills | Status: AC

## 2022-12-22 NOTE — ED Triage Notes (Signed)
Pt in with reported abnormal labs. Pt states he checked his MyChart and found that he had +Staph infection in his bloodstream. Does have a +MRSA nare swab resulted on 2/23. Denies any fevers. States he was scheduled for spinal surgery with Dr. Larose Hires on 2/23, but went to his office and could not proceed with surgery due to tooth infection. He states he cannot get this tooth extracted until Monday. Arrives today with complaint of rash to R leg, ongoing x 1 mo, and pt thinks this may be related to a staph infection.

## 2022-12-22 NOTE — ED Provider Notes (Signed)
Emergency Department Provider Note   I have reviewed the triage vital signs and the nursing notes.   HISTORY  Chief Complaint Abnormal Lab   HPI Vincent Stout is a 42 y.o. male with past history of chronic back pain presents to the emergency department with dental pain, rash to the legs, concern over lab result.  Patient tells me that he was due for surgery with Dr. Christella Noa this past week.  As part of a preoperative workup he had basic labs and MRSA nasal swab screening.  The nasal swab came back positive for Staph aureus.  He saw the results in [place] and assumed this meant bacteremia.  He has not had fevers or chills.  The procedure was canceled last week due to dental pain.  He notes that he is not having any difficulty swallowing or chewing but his teeth in the right upper molars have begun to hurt again recently.  He has an appointment with his dentist on Monday.  No antibiotics have been started to this point.   He also notes rash to the right lower leg which has been present for several weeks and appears to be spreading.  No drainage.    Past Medical History:  Diagnosis Date   Chronic back pain    Paralysis (HCC)    right leg weakness   Pneumonia     Review of Systems  Constitutional: No fever/chills ENT: Positive dental pain.  Cardiovascular: Denies chest pain. Respiratory: Denies shortness of breath. Gastrointestinal: No abdominal pain.  No nausea, no vomiting.  No diarrhea.  No constipation. Genitourinary: Negative for dysuria. Musculoskeletal: Positive for chronic neck/back pain. Skin: Positive for rash. Neurological: Negative for headaches, focal weakness or numbness.  ____________________________________________   PHYSICAL EXAM:  VITAL SIGNS: ED Triage Vitals  Enc Vitals Group     BP 12/22/22 0015 (!) 159/81     Pulse Rate 12/22/22 0015 62     Resp 12/22/22 0015 18     Temp 12/22/22 0015 99.3 F (37.4 C)     Temp src --      SpO2 12/22/22 0015  100 %     Weight 12/22/22 0018 195 lb (88.5 kg)   Constitutional: Alert and oriented. Well appearing and in no acute distress. Eyes: Conjunctivae are normal.  Head: Atraumatic. Nose: No congestion/rhinnorhea. Mouth/Throat: Mucous membranes are moist.  Oropharynx non-erythematous.  Poor dentition throughout but no visible abscess.  No trismus.  Soft submandibular compartment. Clear voice. Managing oral secretions.  Neck: No stridor.   Cardiovascular: Good peripheral circulation.  Respiratory: Normal respiratory effort.  Gastrointestinal: No distention.  Musculoskeletal: No lower extremity tenderness nor edema. No gross deformities of extremities. Compartments are soft.  Neurologic:  Normal speech and language. No gross focal neurologic deficits are appreciated.  Skin:  Skin is warm, dry and intact.  Right anterior leg with dark, slightly circular rash with central clearing.  No erythema or warmth.  No drainage.  ____________________________________________   PROCEDURES  Procedure(s) performed:   Procedures  None  ____________________________________________   INITIAL IMPRESSION / ASSESSMENT AND PLAN / ED COURSE  Pertinent labs & imaging results that were available during my care of the patient were reviewed by me and considered in my medical decision making (see chart for details).   This patient is Presenting for Evaluation of dental pain, which does require a range of treatment options, and is a complaint that involves a high risk of morbidity and mortality.  The Differential Diagnoses include dental abscess,  gingivitis, ludwig's angina, etc.   I did obtain Additional Historical Information from partner at bedside.   I decided to review pertinent External Data, and in summary patient with staph positive nasal swab from 12/20/22. No blood cultures sent. ABO Rh sent as well.    Social Determinants of Health Risk: patient is not an active smoker.   Medical Decision Making:  Summary:  Patient presents to the emergency department with dental pain, leg rash, concern for a positive Staph aureus nasal swab.  It seems he was under the impression this may be a blood culture and suggest bacteremia.  He is not having any infectious symptoms.  I have reviewed the chart and see that this was a nasal swab in the preoperative setting.  I do plan to cover him with penicillin for his dental pain.  He has a follow-up with his dentist on Monday.  No visible/drainable abscess here.  No concern for deeper space infection as above.  The rash on his leg is most consistent with tinea infection and have sent him home with description for clotrimazole.  After resolution of his dental symptoms he will coordinate with neurosurgery to reschedule his procedure.  He notes that his meloxicam is not working and wishes to transition back to Motrin 800 mg.  I have refilled this prescription as well.   Patient's presentation is most consistent with acute, uncomplicated illness.   Disposition: discharge  ____________________________________________  FINAL CLINICAL IMPRESSION(S) / ED DIAGNOSES  Final diagnoses:  Pain, dental  Tinea corporis     NEW OUTPATIENT MEDICATIONS STARTED DURING THIS VISIT:  New Prescriptions   CLOTRIMAZOLE-BETAMETHASONE (LOTRISONE) CREAM    Apply 1 Application topically 2 (two) times daily for 28 days.   IBUPROFEN (ADVIL) 800 MG TABLET    Take 1 tablet (800 mg total) by mouth every 8 (eight) hours as needed for moderate pain.   PENICILLIN V POTASSIUM (VEETID) 500 MG TABLET    Take 1 tablet (500 mg total) by mouth 4 (four) times daily for 7 days.    Note:  This document was prepared using Dragon voice recognition software and may include unintentional dictation errors.  Nanda Quinton, MD, [facility] Emergency Medicine    Satara Virella, Wonda Olds, MD 12/22/22 [phone]

## 2022-12-22 NOTE — Discharge Instructions (Signed)
You were seen in the emergency room today with dental pain and a rash in your leg.  Use the cream provided on the leg rash.  This takes some time to treat and you will need to apply the cream as prescribed over 4 weeks.  I have called in some antibiotics to help with your dental pain.  Please see your dentist on Monday for further evaluation. Afterwards, please follow with your spine team regarding re-scheduling your surgery.

## 2023-02-01 IMAGING — CR DG ABDOMEN 1V
2 series · 2 of 2 positions shown · non-contrast
Comparison: CT 07/22/2016

CLINICAL DATA: Constipation

EXAM:
ABDOMEN - 1 VIEW

[t abdomen supine (1 of 2)]
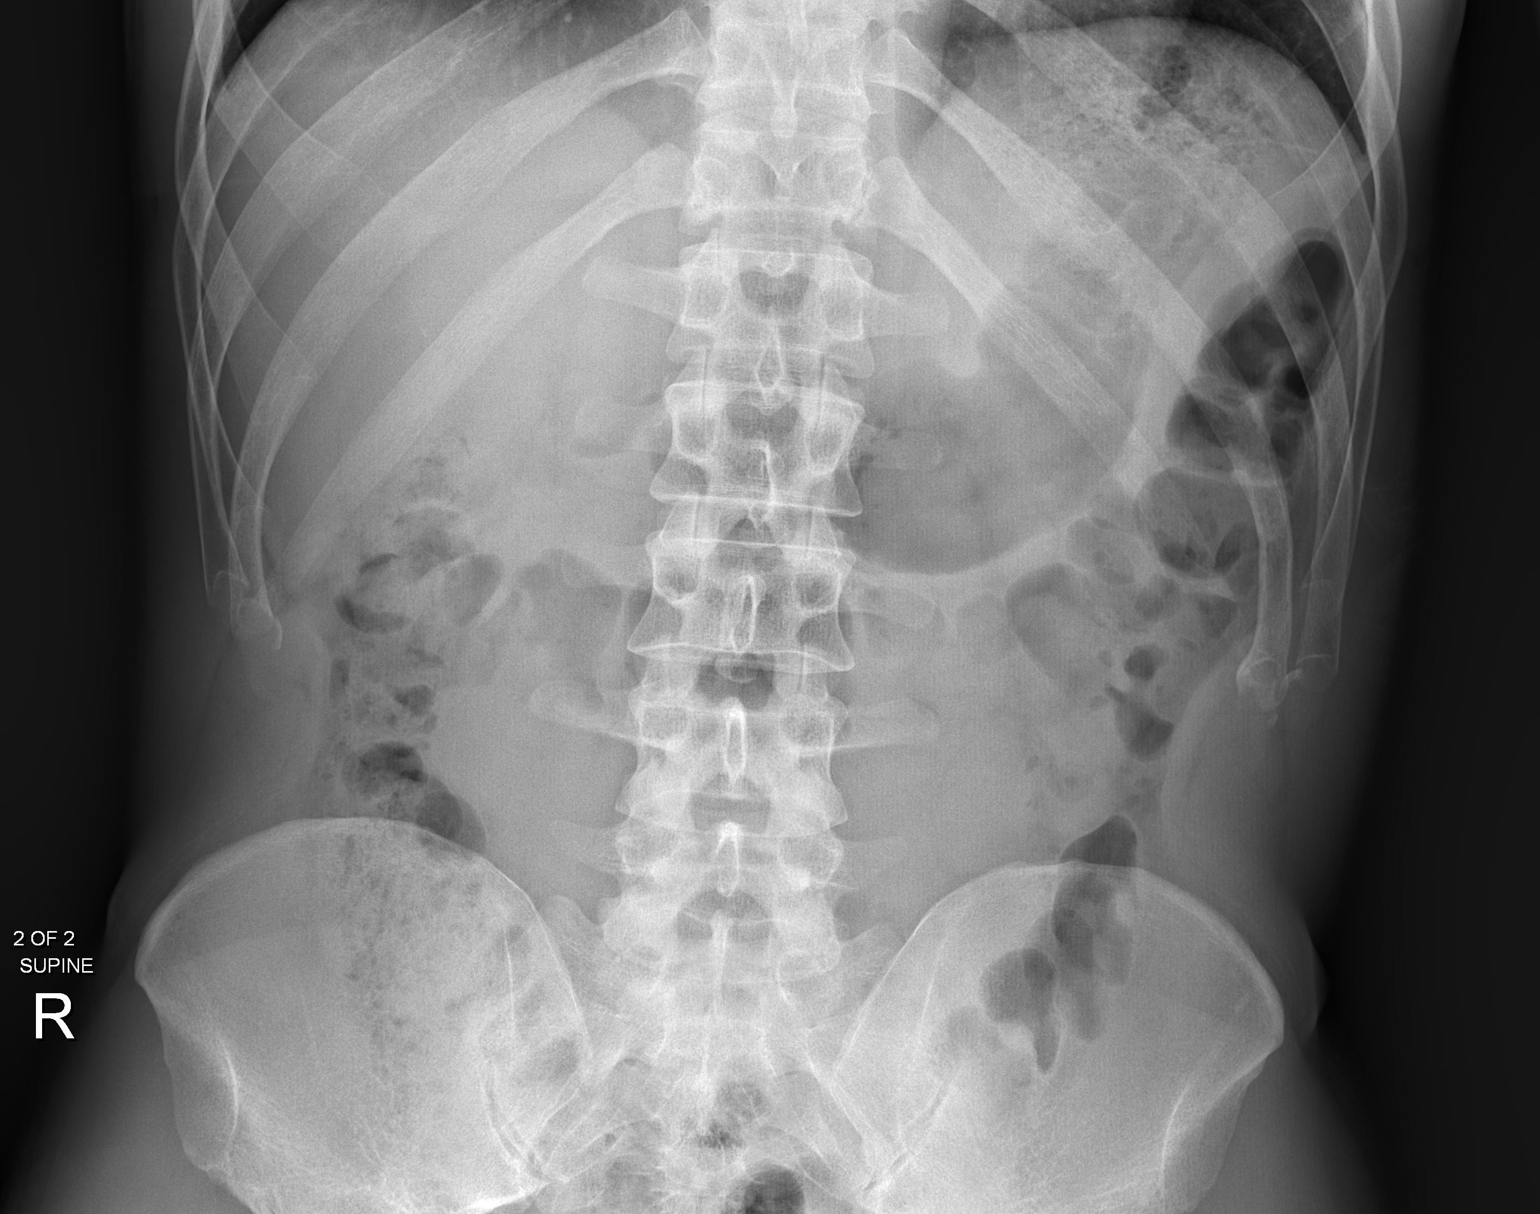

[t abdomen supine (2 of 2)]
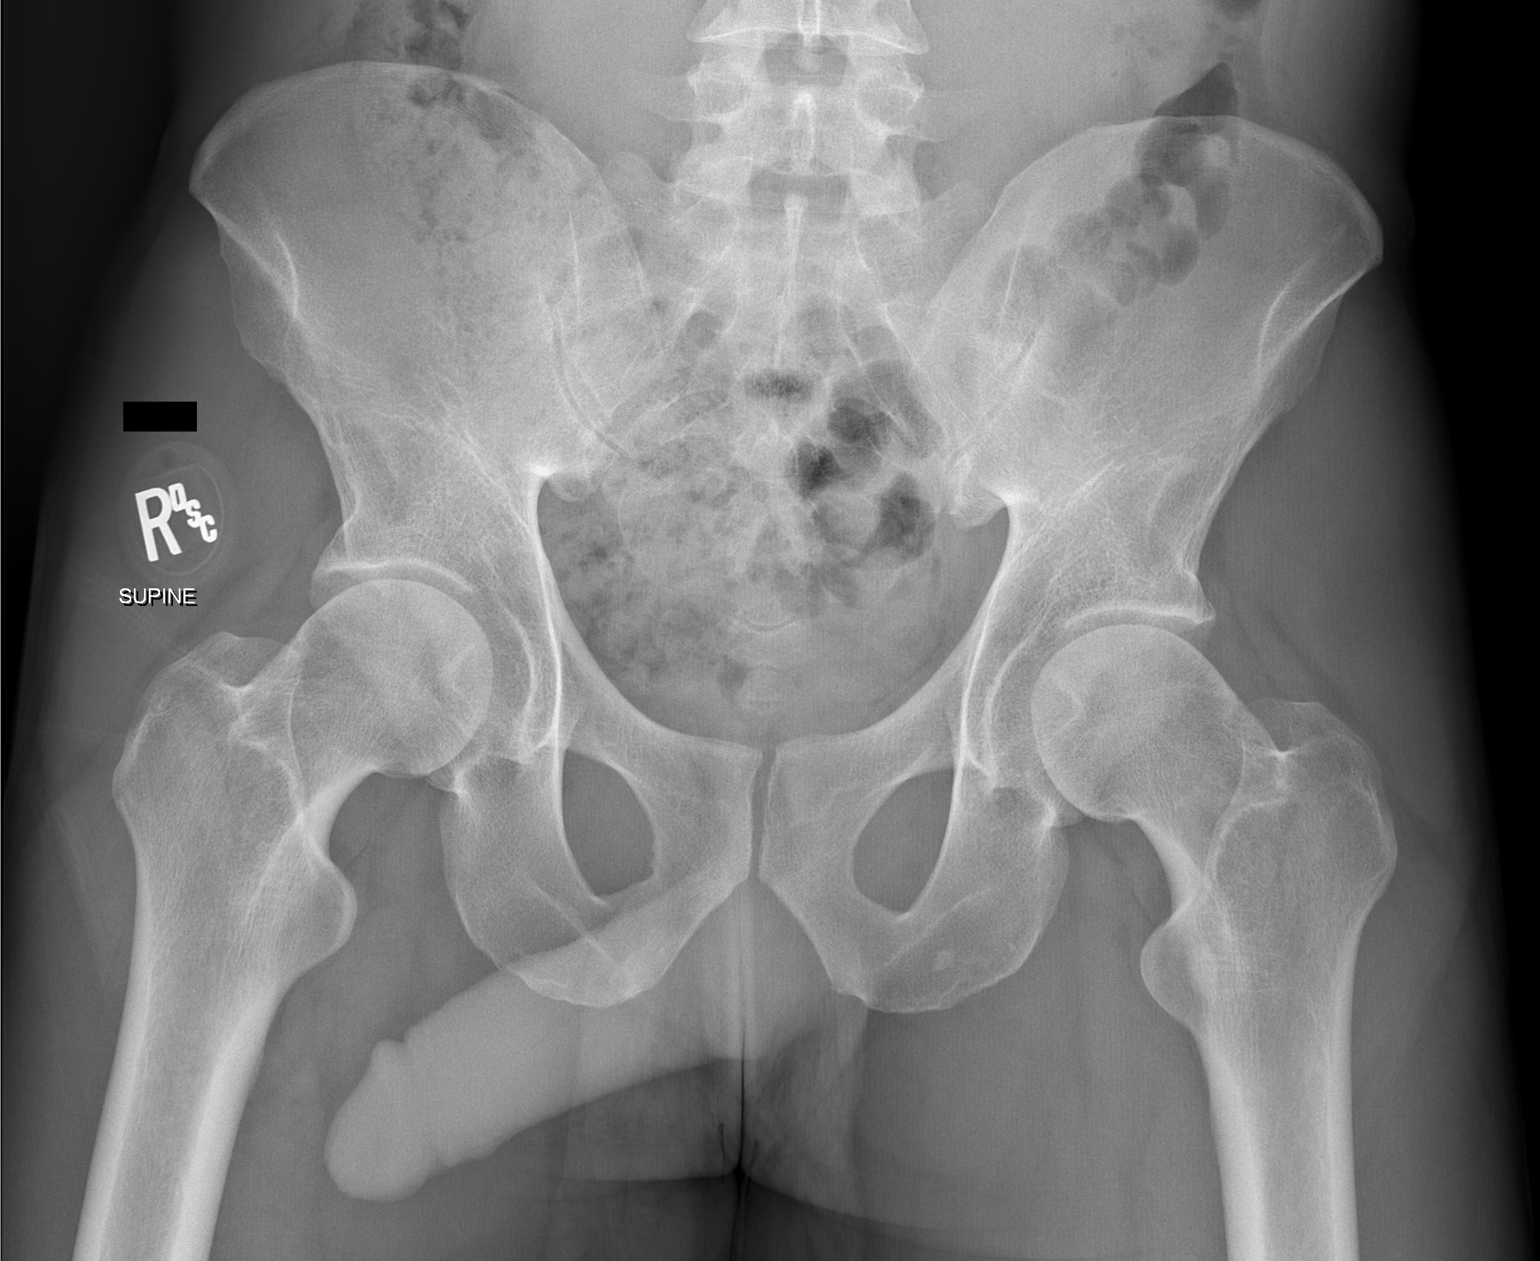

[2 of 2 positions shown; findings below may reference images not displayed]

FINDINGS: The bowel gas pattern is normal. No radio-opaque calculi or other
significant radiographic abnormality are seen. Moderate stool in the
colon.
IMPRESSION: Negative.  Moderate stool burden

## 2023-05-27 ENCOUNTER — Other Ambulatory Visit: Payer: Self-pay | Admitting: Neurosurgery

## 2023-05-27 DIAGNOSIS — M4712 Other spondylosis with myelopathy, cervical region: Secondary | ICD-10-CM | POA: Diagnosis not present

## 2023-05-27 DIAGNOSIS — Z6825 Body mass index (BMI) 25.0-25.9, adult: Secondary | ICD-10-CM | POA: Diagnosis not present

## 2023-05-27 DIAGNOSIS — G4452 New daily persistent headache (NDPH): Secondary | ICD-10-CM

## 2023-05-27 DIAGNOSIS — M5126 Other intervertebral disc displacement, lumbar region: Secondary | ICD-10-CM | POA: Diagnosis not present

## 2023-05-29 ENCOUNTER — Encounter: Payer: Self-pay | Admitting: Neurosurgery

## 2023-06-02 ENCOUNTER — Ambulatory Visit
Admission: RE | Admit: 2023-06-02 | Discharge: 2023-06-02 | Disposition: A | Discharge status: Home / Self Care | Payer: Medicaid Other | Source: Ambulatory Visit | Attending: Neurosurgery | Admitting: Neurosurgery

## 2023-06-02 DIAGNOSIS — R42 Dizziness and giddiness: Secondary | ICD-10-CM | POA: Diagnosis not present

## 2023-06-02 DIAGNOSIS — G44309 Post-traumatic headache, unspecified, not intractable: Secondary | ICD-10-CM | POA: Diagnosis not present

## 2023-06-02 DIAGNOSIS — G4452 New daily persistent headache (NDPH): Secondary | ICD-10-CM

## 2023-06-12 ENCOUNTER — Encounter: Payer: Self-pay | Admitting: Neurosurgery

## 2023-06-17 ENCOUNTER — Other Ambulatory Visit: Payer: Medicaid Other

## 2023-06-28 IMAGING — DX DG ANKLE COMPLETE 3+V*R*
3 series · 3 of 3 positions shown · non-contrast
Comparison: None.

CLINICAL DATA: Trauma, pain

EXAM:
RIGHT ANKLE - COMPLETE 3+ VIEW

[ankle obl (1 of 2)]
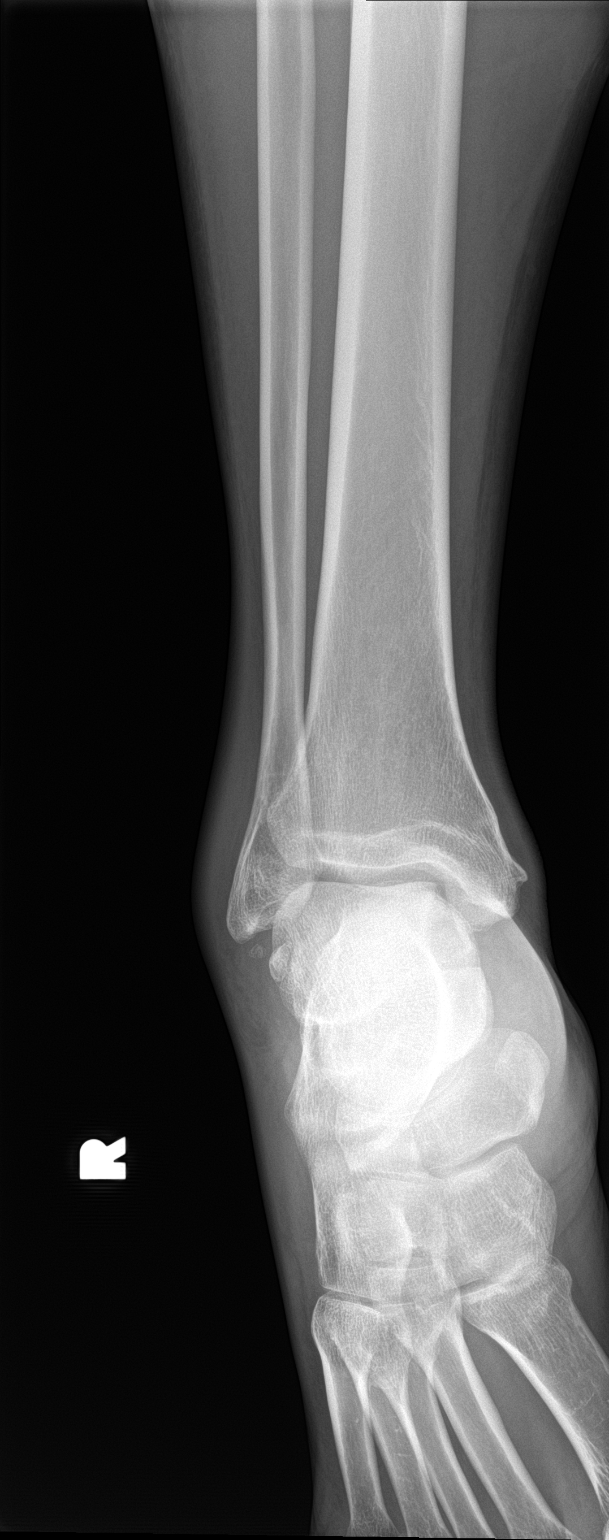

[ankle obl (2 of 2)]
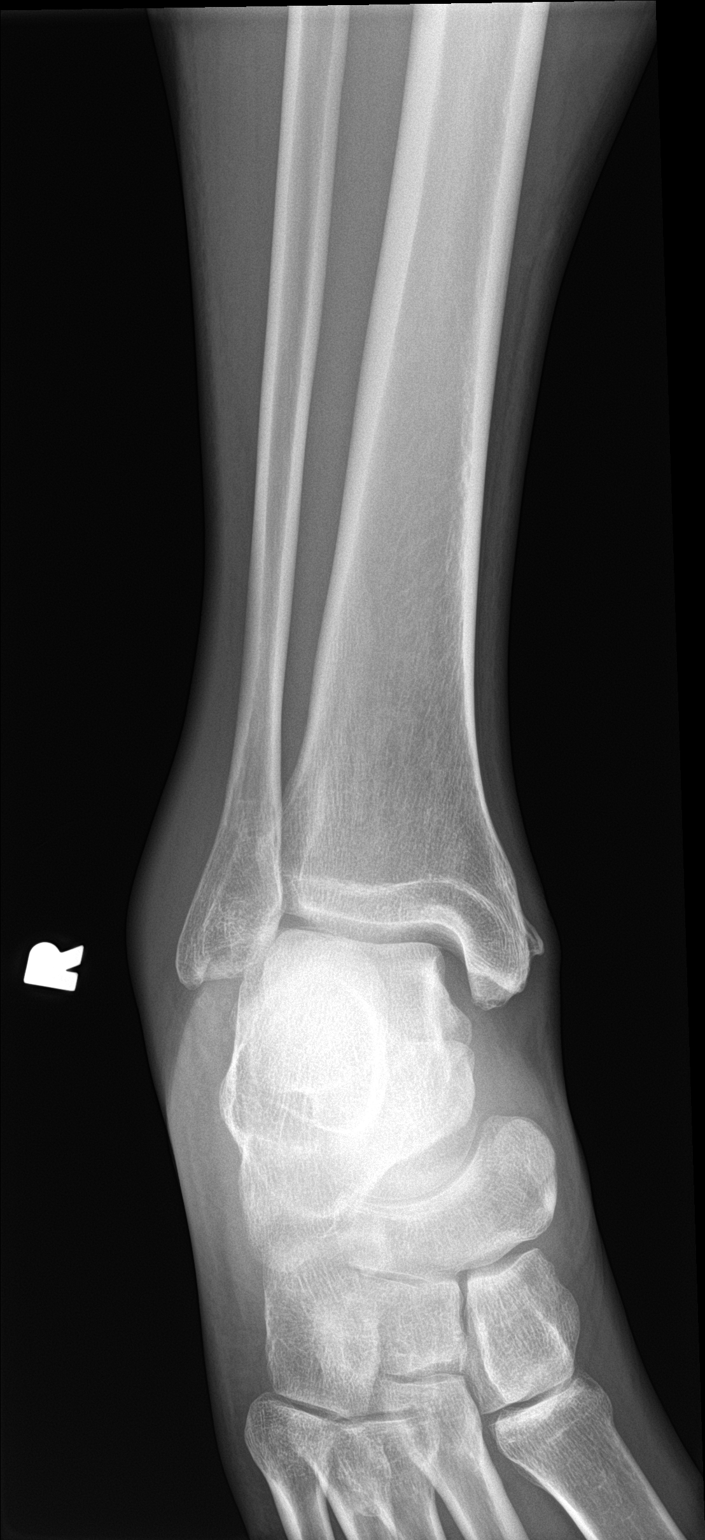

[ankle lat]
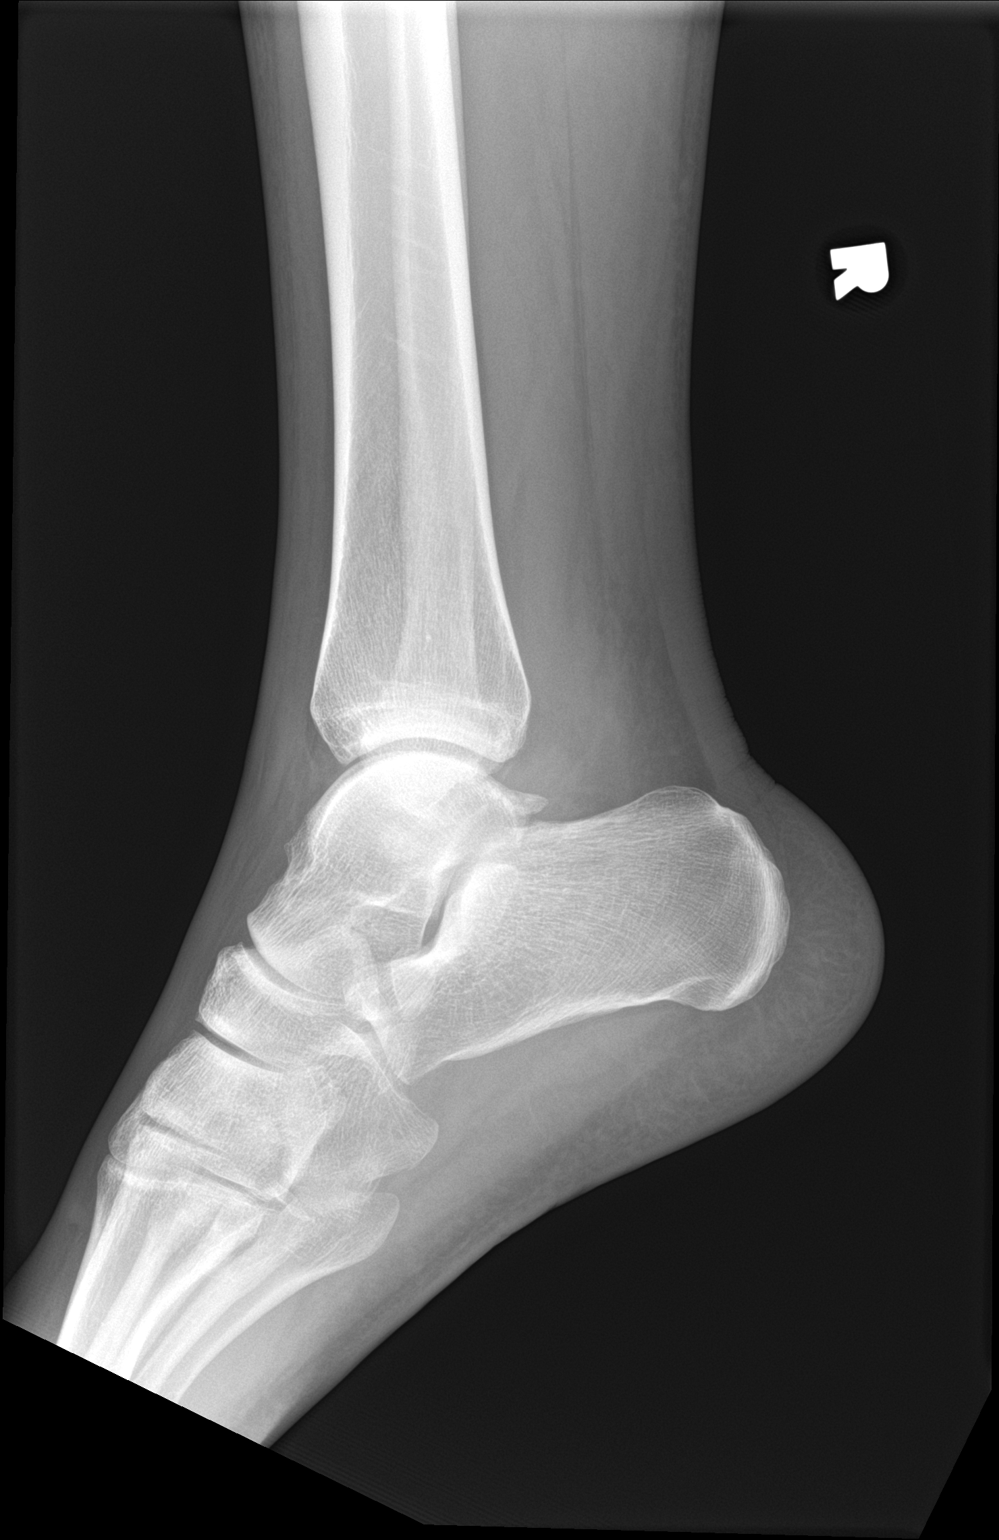

[3 of 3 positions shown; findings below may reference images not displayed]

FINDINGS: No recent fracture or dislocation is seen. Small cortical protrusion
seen in the medial margin of medial malleolus may be residual from
previous injury. There is 3 mm smooth marginated calcification
adjacent to the tip of lateral malleolus, possibly old avulsion.
There is soft tissue swelling over the lateral malleolus. If there
are continued symptoms, follow-up radiographic examination may be
considered.
IMPRESSION: No recent fracture or dislocation is seen in the right ankle.

## 2023-07-19 ENCOUNTER — Ambulatory Visit
Admission: RE | Admit: 2023-07-19 | Discharge: 2023-07-19 | Disposition: A | Discharge status: Home / Self Care | Payer: Medicaid Other | Source: Ambulatory Visit | Attending: Neurosurgery | Admitting: Neurosurgery

## 2023-07-19 DIAGNOSIS — M4802 Spinal stenosis, cervical region: Secondary | ICD-10-CM | POA: Diagnosis not present

## 2023-07-19 DIAGNOSIS — M4712 Other spondylosis with myelopathy, cervical region: Secondary | ICD-10-CM

## 2023-07-19 DIAGNOSIS — M50222 Other cervical disc displacement at C5-C6 level: Secondary | ICD-10-CM | POA: Diagnosis not present

## 2023-09-08 ENCOUNTER — Other Ambulatory Visit: Payer: Self-pay | Admitting: Neurosurgery

## 2023-09-08 DIAGNOSIS — M4712 Other spondylosis with myelopathy, cervical region: Secondary | ICD-10-CM | POA: Diagnosis not present

## 2023-10-06 ENCOUNTER — Other Ambulatory Visit: Payer: Self-pay | Admitting: Neurosurgery

## 2023-10-07 NOTE — Progress Notes (Signed)
Surgical Instructions   Your procedure is scheduled on Friday October 10, 2023. Report to [facility] Main Entrance "A" at 5:30 A.M., then check in with the Admitting office. Any questions or running late day of surgery: call [phone]  Questions prior to your surgery date: call [phone], Monday-Friday, 8am-4pm. If you experience any cold or flu symptoms such as cough, fever, chills, shortness of breath, etc. between now and your scheduled surgery, please notify us at the above number.     Remember:  Do not eat or drink after midnight the night before your surgery   Take these medicines if needed the morning of surgery with A SIP OF WATER  gabapentin (NEURONTIN)  guaiFENesin (MUCINEX)  oxyCODONE (OXY IR/ROXICODONE)    One week prior to surgery, STOP taking any Aspirin (unless otherwise instructed by your surgeon) Aleve, Naproxen, Ibuprofen, Motrin, Advil, Goody's, BC's, all herbal medications, fish oil, and non-prescription vitamins.                     Do NOT Smoke (Tobacco/Vaping) for 24 hours prior to your procedure.  If you use a CPAP at night, you may bring your mask/headgear for your overnight stay.   You will be asked to remove any contacts, glasses, piercing's, hearing aid's, dentures/partials prior to surgery. Please bring cases for these items if needed.    Patients discharged the day of surgery will not be allowed to drive home, and someone needs to stay with them for 24 hours.  SURGICAL WAITING ROOM VISITATION Patients may have no more than 2 support people in the waiting area - these visitors may rotate.   Pre-op nurse will coordinate an appropriate time for 1 ADULT support person, who may not rotate, to accompany patient in pre-op.  Children under the age of 70 must have an adult with them who is not the patient and must remain in the main waiting area with an adult.  If the patient needs to stay at the hospital during part of their recovery, the visitor  guidelines for inpatient rooms apply.  Please refer to the [facility] website for the visitor guidelines for any additional information.   If you received a COVID test during your pre-op visit  it is requested that you wear a mask when out in public, stay away from anyone that may not be feeling well and notify your surgeon if you develop symptoms. If you have been in contact with anyone that has tested positive in the last 10 days please notify you surgeon.      Pre-operative 5 CHG Bathing Instructions   You can play a key role in reducing the risk of infection after surgery. Your skin needs to be as free of germs as possible. You can reduce the number of germs on your skin by washing with CHG (chlorhexidine gluconate) soap before surgery. CHG is an antiseptic soap that kills germs and continues to kill germs even after washing.   DO NOT use if you have an allergy to chlorhexidine/CHG or antibacterial soaps. If your skin becomes reddened or irritated, stop using the CHG and notify one of our RNs at [phone].   Please shower with the CHG soap starting 4 days before surgery using the following schedule:     Please keep in mind the following:  DO NOT shave, including legs and underarms, starting the day of your first shower.   You may shave your face at any point before/day of surgery.  Place clean  sheets on your bed the day you start using CHG soap. Use a clean washcloth (not used since being washed) for each shower. DO NOT sleep with pets once you start using the CHG.   CHG Shower Instructions:  Wash your face and private area with normal soap. If you choose to wash your hair, wash first with your normal shampoo.  After you use shampoo/soap, rinse your hair and body thoroughly to remove shampoo/soap residue.  Turn the water OFF and apply about 3 tablespoons (45 ml) of CHG soap to a CLEAN washcloth.  Apply CHG soap ONLY FROM YOUR NECK DOWN TO YOUR TOES (washing for 3-5 minutes)   DO NOT use CHG soap on face, private areas, open wounds, or sores.  Pay special attention to the area where your surgery is being performed.  If you are having back surgery, having someone wash your back for you may be helpful. Wait 2 minutes after CHG soap is applied, then you may rinse off the CHG soap.  Pat dry with a clean towel  Put on clean clothes/pajamas   If you choose to wear lotion, please use ONLY the CHG-compatible lotions on the back of this paper.   Additional instructions for the day of surgery: DO NOT APPLY any lotions, deodorants or cologne.   Do not bring valuables to the hospital. [facility] is not responsible for any belongings/valuables. Do not wear jewelry Put on clean/comfortable clothes.  Please brush your teeth.  Ask your nurse before applying any prescription medications to the skin.     CHG Compatible Lotions   Aveeno Moisturizing lotion  Cetaphil Moisturizing Cream  Cetaphil Moisturizing Lotion  Clairol Herbal Essence Moisturizing Lotion, Dry Skin  Clairol Herbal Essence Moisturizing Lotion, Extra Dry Skin  Clairol Herbal Essence Moisturizing Lotion, Normal Skin  Curel Age Defying Therapeutic Moisturizing Lotion with Alpha Hydroxy  Curel Extreme Care Body Lotion  Curel Soothing Hands Moisturizing Hand Lotion  Curel Therapeutic Moisturizing Cream, Fragrance-Free  Curel Therapeutic Moisturizing Lotion, Fragrance-Free  Curel Therapeutic Moisturizing Lotion, Original Formula  Eucerin Daily Replenishing Lotion  Eucerin Dry Skin Therapy Plus Alpha Hydroxy Crme  Eucerin Dry Skin Therapy Plus Alpha Hydroxy Lotion  Eucerin Original Crme  Eucerin Original Lotion  Eucerin Plus Crme Eucerin Plus Lotion  Eucerin TriLipid Replenishing Lotion  Keri Anti-Bacterial Hand Lotion  Keri Deep Conditioning Original Lotion Dry Skin Formula Softly Scented  Keri Deep Conditioning Original Lotion, Fragrance Free Sensitive Skin Formula  Keri Lotion Fast Absorbing  Fragrance Free Sensitive Skin Formula  Keri Lotion Fast Absorbing Softly Scented Dry Skin Formula  Keri Original Lotion  Keri Skin Renewal Lotion Keri Silky Smooth Lotion  Keri Silky Smooth Sensitive Skin Lotion  Nivea Body Creamy Conditioning Oil  Nivea Body Extra Enriched Lotion  Nivea Body Original Lotion  Nivea Body Sheer Moisturizing Lotion Nivea Crme  Nivea Skin Firming Lotion  NutraDerm 30 Skin Lotion  NutraDerm Skin Lotion  NutraDerm Therapeutic Skin Cream  NutraDerm Therapeutic Skin Lotion  ProShield Protective Hand Cream  Provon moisturizing lotion  Please read over the following fact sheets that you were given.

## 2023-10-08 ENCOUNTER — Encounter (HOSPITAL_COMMUNITY)
Admission: RE | Admit: 2023-10-08 | Discharge: 2023-10-08 | Disposition: A | Discharge status: Home / Self Care | Payer: Medicaid Other | Source: Ambulatory Visit | Attending: Neurosurgery | Admitting: Neurosurgery

## 2023-10-08 ENCOUNTER — Encounter (HOSPITAL_COMMUNITY): Payer: Self-pay | Admitting: Anesthesiology

## 2023-10-08 ENCOUNTER — Encounter (HOSPITAL_COMMUNITY): Payer: Self-pay | Admitting: Physician Assistant

## 2023-10-08 ENCOUNTER — Encounter (HOSPITAL_COMMUNITY): Payer: Self-pay

## 2023-10-08 ENCOUNTER — Other Ambulatory Visit: Payer: Self-pay

## 2023-10-08 ENCOUNTER — Ambulatory Visit (HOSPITAL_COMMUNITY)
Admission: EM | Admit: 2023-10-08 | Discharge: 2023-10-08 | Disposition: A | Discharge status: Home / Self Care | Payer: Medicaid Other | Attending: Emergency Medicine | Admitting: Emergency Medicine

## 2023-10-08 VITALS — BP 108/73 | HR 64 | Temp 98.5°F | Resp 18 | Ht 71.0 in | Wt 184.6 lb

## 2023-10-08 DIAGNOSIS — Z01818 Encounter for other preprocedural examination: Secondary | ICD-10-CM | POA: Diagnosis not present

## 2023-10-08 DIAGNOSIS — J029 Acute pharyngitis, unspecified: Secondary | ICD-10-CM

## 2023-10-08 LAB — CBC
HCT: 42.4 % (ref 39.0–52.0)
Hemoglobin: 14.2 g/dL (ref 13.0–17.0)
MCH: 30.1 pg (ref 26.0–34.0)
MCHC: 33.5 g/dL (ref 30.0–36.0)
MCV: 89.8 fL (ref 80.0–100.0)
Platelets: 210 10*3/uL (ref 150–400)
RBC: 4.72 MIL/uL (ref 4.22–5.81)
RDW: 13.4 % (ref 11.5–15.5)
WBC: 10.8 10*3/uL — ABNORMAL HIGH (ref 4.0–10.5)
nRBC: 0 % (ref 0.0–0.2)

## 2023-10-08 LAB — POC COVID19/FLU A&B COMBO
Covid Antigen, POC: NEGATIVE
Influenza A Antigen, POC: NEGATIVE
Influenza B Antigen, POC: NEGATIVE

## 2023-10-08 LAB — SURGICAL PCR SCREEN
MRSA, PCR: NEGATIVE
Staphylococcus aureus: NEGATIVE

## 2023-10-08 NOTE — ED Provider Notes (Signed)
[facility]    CSN: [id number] Arrival date & time: 10/08/23  1633      History   Chief Complaint Chief Complaint  Patient presents with   Sore Throat    HPI Vincent Stout is a 42 y.o. male. He is having neck surgery in 2 days. Has had a sore throat and his neck feels "tight" like his collars are tight and it's uncomfortable to wear necklaces for 3 days. Reports nasal congestion, decreased sense of smell, post nasal drainage and sore throat. Endorses some body aches and cough. Surgeon's office told him to get checked   Sore Throat    Past Medical History:  Diagnosis Date   Chronic back pain    Paralysis (HCC)    right leg weakness   Pneumonia     Patient Active Problem List   Diagnosis Date Noted   Cervical myelopathy (HCC) 05/01/2022   Spinal stenosis of lumbar region 05/01/2022   Gait abnormality 02/26/2022   Right leg weakness 02/26/2022   Neck pain 02/26/2022   Confusion 02/26/2022    Past Surgical History:  Procedure Laterality Date   No past surgery         Home Medications    Prior to Admission medications   Medication Sig Start Date End Date Taking? Authorizing Provider  gabapentin (NEURONTIN) 300 MG capsule Take 3 capsules (900 mg total) by mouth 3 (three) times daily. Patient taking differently: Take 300 mg by mouth 3 (three) times daily as needed (neuropathy). 05/07/22 10/06/23  Levert Feinstein, MD  guaiFENesin (MUCINEX) 600 MG 12 hr tablet Take 1,200 mg by mouth 2 (two) times daily as needed (congestion).    [provider]  oxyCODONE (OXY IR/ROXICODONE) 5 MG immediate release tablet Take 5 mg by mouth every 6 (six) hours as needed for severe pain (pain score 7-10). 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   Review of Systems Review of Systems   Physical Exam Triage Vital Signs ED Triage Vitals  Encounter Vitals Group     BP 10/08/23 1654 112/73     Systolic BP Percentile --      Diastolic BP Percentile --      Pulse Rate 10/08/23 1654 66     Resp 10/08/23 1654 18     Temp 10/08/23 1654 98.8 F (37.1 C)     Temp Source 10/08/23 1654 Oral     SpO2 10/08/23 1654 98 %     Weight --      Height --      Head Circumference --      Peak Flow --      Pain Score 10/08/23 1653 6     Pain Loc --      Pain Education --      Exclude from Growth Chart --  No data found.  Updated Vital Signs BP 112/73 (BP Location: Left Arm)   Pulse 66   Temp 98.8 F (37.1 C) (Oral)   Resp 18   SpO2 98%   Visual Acuity Right Eye Distance:   Left Eye Distance:   Bilateral Distance:    Right Eye Near:   Left Eye Near:    Bilateral Near:     Physical Exam Constitutional:      Appearance: He is well-developed. He is not ill-appearing.  HENT:     Right Ear: Tympanic membrane and ear canal normal.     Left Ear: Tympanic membrane and ear canal normal.     Mouth/Throat:     Mouth: Mucous membranes are moist. No oral lesions.     Pharynx: Oropharynx is clear. No pharyngeal swelling, oropharyngeal exudate, posterior oropharyngeal erythema or uvula swelling.     Tonsils: No tonsillar exudate or tonsillar abscesses. 0 on the right. 0 on the left.  Neck:     Thyroid: No thyroid mass, thyromegaly or thyroid tenderness.     Trachea: Trachea normal.  Cardiovascular:     Rate and Rhythm: Normal rate and regular rhythm.  Pulmonary:     Effort: Pulmonary effort is normal.      Breath sounds: Normal breath sounds.  Lymphadenopathy:     Cervical:     Right cervical: No superficial cervical adenopathy.    Left cervical: No superficial cervical adenopathy.  Neurological:     Mental Status: He is alert.      UC Treatments / Results  Labs (all labs ordered are listed, but only abnormal results are displayed) Labs Reviewed  POC COVID19/FLU A&B COMBO    EKG   Radiology No results found.  Procedures Procedures (including critical care time)  Medications Ordered in UC Medications - No data to display  Initial Impression / Assessment and Plan / UC Course  I have reviewed the triage vital signs and the nursing notes.  Pertinent labs & imaging results that were available during my care of the patient were reviewed by me and considered in my medical decision making (see chart for details).    Flu and covid negative. He does not appear acutely ill but could have a URI. Could also have some mild allergy symptoms with sore throat caused by post nasal drainage. Recommended he try mucinex and an antihistamine, supoortive care measures and see how he feels in the morning. If he feels like he has a cold, he will let his surgeon know  Final Clinical Impressions(s) / UC Diagnoses   Final diagnoses:  Sore throat     Discharge Instructions      Start taking mucinex again. Also try Zyrtec or claritin (one or the other; ok to use generic versions of these medicines). You can also try taking steamy showers and gargling with salt water. For pain, only use tylenol (acetaminophen) since your surgery in scheduled for a couple of days from now.   See how you feel in the morning. If you are feeling better, carry on. If you are still feeling like you have a cold - sore throat, congestion, drainage down your throat - let your surgeon know. It's better to not have surgery when you are sick.      ED Prescriptions   None    PDMP not reviewed this encounter.    Cathlyn Parsons, NP 10/08/23 [phone]

## 2023-10-08 NOTE — ED Triage Notes (Signed)
Pt c/o sore throat x4 days. Pt had a pre-op check in this AM for surgery on Friday. Was told he needed to have this assessed prior to surgery. States throat feels swollen/tight. Endorses some body aches and cough.

## 2023-10-08 NOTE — Discharge Instructions (Signed)
Start taking mucinex again. Also try Zyrtec or claritin (one or the other; ok to use generic versions of these medicines). You can also try taking steamy showers and gargling with salt water. For pain, only use tylenol (acetaminophen) since your surgery in scheduled for a couple of days from now.   See how you feel in the morning. If you are feeling better, carry on. If you are still feeling like you have a cold - sore throat, congestion, drainage down your throat - let your surgeon know. It's better to not have surgery when you are sick.

## 2023-10-08 NOTE — Progress Notes (Signed)
PCP - NO PCP Cardiologist - denies  PPM/ICD - denies Device Orders - n/a Rep Notified - n/a  Chest x-ray - denies EKG - 12-20-22 Stress Test - denies ECHO - denies Cardiac Cath - denies  Sleep Study - denies CPAP - n/a  Dm - denies  Blood Thinner Instructions: denies Aspirin Instructions:n/a  ERAS Protcol - NPO   COVID TEST- no   Anesthesia review: Yes.Anesthesia made aware of patient having complaints of having a sore throat for last week and dizziness for last for last two days.  Made patient aware of need to go to urgent care for follow up and make Dr. Franky Macho office aware.  Patient also reported cough but has had since covid but denies it being any worse.  Patient denies shortness of breath, fever, cough and chest pain at PAT appointment   All instructions explained to the patient, with a verbal understanding of the material. Patient agrees to go over the instructions while at home for a better understanding. Patient also instructed to self quarantine after being tested for COVID-19. The opportunity to ask questions was provided.

## 2023-10-09 DIAGNOSIS — J029 Acute pharyngitis, unspecified: Secondary | ICD-10-CM | POA: Diagnosis not present

## 2023-10-10 ENCOUNTER — Ambulatory Visit (HOSPITAL_COMMUNITY): Admission: RE | Admit: 2023-10-10 | Payer: Medicaid Other | Source: Home / Self Care | Admitting: Neurosurgery

## 2023-10-10 ENCOUNTER — Encounter (HOSPITAL_COMMUNITY): Admission: RE | Payer: Self-pay | Source: Home / Self Care

## 2023-10-10 ENCOUNTER — Encounter (HOSPITAL_BASED_OUTPATIENT_CLINIC_OR_DEPARTMENT_OTHER): Payer: Self-pay

## 2023-10-10 ENCOUNTER — Other Ambulatory Visit: Payer: Self-pay

## 2023-10-10 ENCOUNTER — Emergency Department (HOSPITAL_BASED_OUTPATIENT_CLINIC_OR_DEPARTMENT_OTHER)
Admission: EM | Admit: 2023-10-10 | Discharge: 2023-10-10 | Disposition: A | Discharge status: Home / Self Care | Payer: Medicaid Other | Attending: Emergency Medicine | Admitting: Emergency Medicine

## 2023-10-10 DIAGNOSIS — J029 Acute pharyngitis, unspecified: Secondary | ICD-10-CM

## 2023-10-10 HISTORY — DX: Other cervical disc degeneration, unspecified cervical region: M50.30

## 2023-10-10 LAB — GROUP A STREP BY PCR: Group A Strep by PCR: NOT DETECTED

## 2023-10-10 SURGERY — ANTERIOR CERVICAL DECOMPRESSION/DISCECTOMY FUSION 1 LEVEL
Anesthesia: General

## 2023-10-10 MED ORDER — MIDAZOLAM HCL 2 MG/2ML IJ SOLN
INTRAMUSCULAR | Status: AC
  Filled 2023-10-10: qty 2

## 2023-10-10 MED ORDER — CEPHALEXIN 500 MG PO CAPS: 500.0000 mg | ORAL_CAPSULE | Freq: Three times a day (TID) | ORAL | 0 refills | Status: AC

## 2023-10-10 MED ORDER — ROCURONIUM BROMIDE 10 MG/ML (PF) SYRINGE
PREFILLED_SYRINGE | INTRAVENOUS | Status: AC
  Filled 2023-10-10: qty 10

## 2023-10-10 MED ORDER — PROPOFOL 10 MG/ML IV BOLUS
INTRAVENOUS | Status: AC
  Filled 2023-10-10: qty 20

## 2023-10-10 MED ORDER — FENTANYL CITRATE (PF) 250 MCG/5ML IJ SOLN
INTRAMUSCULAR | Status: AC
  Filled 2023-10-10: qty 5

## 2023-10-10 MED ORDER — CEPHALEXIN 250 MG PO CAPS
500.0000 mg | ORAL_CAPSULE | Freq: Once | ORAL | Status: AC
  Administered 2023-10-10: 500 mg via ORAL
  Filled 2023-10-10: qty 2

## 2023-10-10 MED ORDER — LIDOCAINE 2% (20 MG/ML) 5 ML SYRINGE
INTRAMUSCULAR | Status: AC
  Filled 2023-10-10: qty 5

## 2023-10-10 NOTE — ED Triage Notes (Signed)
Pt reports sore throat x 1 week. Pt seen at urgent care yesterday and tested for flu. Flu (-). Pt was note tested for strep at urgent care. Denies fevers at home.

## 2023-10-10 NOTE — ED Provider Notes (Signed)
[place] EMERGENCY DEPARTMENT AT [facility] Provider Note   CSN: [id number] Arrival date & time: 10/09/23  2359     History  Chief Complaint  Patient presents with   Sore Throat    Vincent Stout is a 42 y.o. male.  Patient is a 42 year old male with history of spinal stenosis scheduled for neck surgery tomorrow.  Patient presenting today with complaints of sore throat.  The sore throat has been worsening over the past week.  Patient seen in urgent care yesterday and tested for flu and COVID, but both were negative.  He was discharged with Mucinex and advised to follow-up as needed.  He is concerned something else is going on with his throat.  He has pain when he swallows.  He was not tested for strep.  The history is provided by the patient.       Home Medications Prior to Admission medications   Medication Sig Start Date End Date Taking? Authorizing Provider  gabapentin (NEURONTIN) 300 MG capsule Take 3 capsules (900 mg total) by mouth 3 (three) times daily. Patient taking differently: Take 300 mg by mouth 3 (three) times daily as needed (neuropathy). 05/07/22 10/06/23  Levert Feinstein, MD  guaiFENesin (MUCINEX) 600 MG 12 hr tablet Take 1,200 mg by mouth 2 (two) times daily as needed (congestion).    [provider]  oxyCODONE (OXY IR/ROXICODONE) 5 MG immediate release tablet Take 5 mg by mouth every 6 (six) hours as needed for severe pain (pain score 7-10). 09/08/23   [provider]      Allergies    Patient has no known allergies.    Review of Systems   Review of Systems  All other systems reviewed and are negative.   Physical Exam Updated Vital Signs BP (!) 144/92 (BP Location: Right Arm)   Pulse (!) 57   Temp 98.2 F (36.8 C) (Oral)   Resp 17   Ht 5\' 11"  (1.803 m)   Wt 83.9 kg   SpO2 100%   BMI 25.80 kg/m  Physical Exam Vitals and nursing note reviewed.  Constitutional:      General: He is not in acute distress.    Appearance: He  is well-developed. He is not diaphoretic.  HENT:     Head: Normocephalic and atraumatic.     Mouth/Throat:     Mouth: Mucous membranes are moist. No oral lesions.     Pharynx: Posterior oropharyngeal erythema present. No pharyngeal swelling or oropharyngeal exudate.  Cardiovascular:     Rate and Rhythm: Normal rate and regular rhythm.     Heart sounds: No murmur heard.    No friction rub.  Pulmonary:     Effort: Pulmonary effort is normal. No respiratory distress.     Breath sounds: Normal breath sounds. No wheezing or rales.  Abdominal:     General: Bowel sounds are normal. There is no distension.     Palpations: Abdomen is soft.     Tenderness: There is no abdominal tenderness.  Musculoskeletal:        General: Normal range of motion.     Cervical back: Normal range of motion and neck supple.  Skin:    General: Skin is warm and dry.  Neurological:     Mental Status: He is alert and oriented to person, place, and time.     Coordination: Coordination normal.     ED Results / Procedures / Treatments   Labs (all labs ordered are listed, but only abnormal results  are displayed) Labs Reviewed  GROUP A STREP BY PCR    EKG None  Radiology No results found.  Procedures Procedures    Medications Ordered in ED Medications - No data to display  ED Course/ Medical Decision Making/ A&P  Patient is a 42 year old male presenting with sore throat as described in the HPI.  Patient arrives here with stable vital signs and is afebrile.  Physical examination reveals erythema of the posterior oropharynx, but is otherwise unremarkable.  COVID and flu test performed earlier today at urgent care were negative and strep test performed here today is also negative.  Symptoms likely viral in nature, but have been ongoing for 1 week and worsening.  I will treat with Keflex and see if this helps.  Patient is to follow-up with primary doctor if not improving.  He does have surgery scheduled  on his cervical spine for tomorrow.  I have advised him to contact his surgeon as I suspect they will likely not perform this given his current illness.  Final Clinical Impression(s) / ED Diagnoses Final diagnoses:  None    Rx / DC Orders ED Discharge Orders     None         Geoffery Lyons, MD 10/10/23 [phone]

## 2023-10-10 NOTE — Discharge Instructions (Signed)
Begin taking Keflex as prescribed.  Take ibuprofen 600 mg every 6 hours as needed for pain.  Plenty of fluids and get plenty of rest.  Follow-up with primary doctor if not improving in the next few days.

## 2024-02-25 DIAGNOSIS — Z202 Contact with and (suspected) exposure to infections with a predominantly sexual mode of transmission: Secondary | ICD-10-CM | POA: Diagnosis not present

## 2024-04-06 DIAGNOSIS — R21 Rash and other nonspecific skin eruption: Secondary | ICD-10-CM | POA: Diagnosis not present

## 2024-04-25 ENCOUNTER — Emergency Department (HOSPITAL_BASED_OUTPATIENT_CLINIC_OR_DEPARTMENT_OTHER)
Admission: EM | Admit: 2024-04-25 | Discharge: 2024-04-25 | Disposition: A | Discharge status: Home / Self Care | Attending: Emergency Medicine | Admitting: Emergency Medicine

## 2024-04-25 ENCOUNTER — Emergency Department (HOSPITAL_BASED_OUTPATIENT_CLINIC_OR_DEPARTMENT_OTHER)

## 2024-04-25 ENCOUNTER — Other Ambulatory Visit: Payer: Self-pay

## 2024-04-25 DIAGNOSIS — R079 Chest pain, unspecified: Secondary | ICD-10-CM | POA: Diagnosis not present

## 2024-04-25 DIAGNOSIS — R072 Precordial pain: Secondary | ICD-10-CM | POA: Insufficient documentation

## 2024-04-25 LAB — BASIC METABOLIC PANEL WITH GFR
Anion gap: 9 (ref 5–15)
BUN: <5 mg/dL — ABNORMAL LOW (ref 6–20)
CO2: 27 mmol/L (ref 22–32)
Calcium: 10 mg/dL (ref 8.9–10.3)
Chloride: 101 mmol/L (ref 98–111)
Creatinine, Ser: 1.13 mg/dL (ref 0.61–1.24)
GFR, Estimated: >60 mL/min (ref >60)
Glucose, Bld: 92 mg/dL (ref 70–99)
Potassium: 3.8 mmol/L (ref 3.5–5.1)
Sodium: 136 mmol/L (ref 135–145)

## 2024-04-25 LAB — CBC
HCT: 42.1 % (ref 39.0–52.0)
Hemoglobin: 14.6 g/dL (ref 13.0–17.0)
MCH: 30.5 pg (ref 26.0–34.0)
MCHC: 34.7 g/dL (ref 30.0–36.0)
MCV: 88.1 fL (ref 80.0–100.0)
Platelets: 214 10*3/uL (ref 150–400)
RBC: 4.78 MIL/uL (ref 4.22–5.81)
RDW: 13.4 % (ref 11.5–15.5)
WBC: 9.5 10*3/uL (ref 4.0–10.5)
nRBC: 0 % (ref 0.0–0.2)

## 2024-04-25 LAB — TROPONIN T, HIGH SENSITIVITY
Troponin T High Sensitivity: <15 ng/L (ref <19)
Troponin T High Sensitivity: <15 ng/L (ref <19)

## 2024-04-25 LAB — D-DIMER, QUANTITATIVE: D-Dimer, Quant: 0.37 ug{FEU}/mL (ref 0.00–0.50)

## 2024-04-25 MED ORDER — IBUPROFEN 800 MG PO TABS: 800.0000 mg | ORAL_TABLET | Freq: Three times a day (TID) | ORAL | 0 refills | Status: AC

## 2024-04-25 NOTE — ED Provider Notes (Signed)
 [place] EMERGENCY DEPARTMENT AT [facility] Provider Note   CSN: [id number] Arrival date & time: 04/25/24  8190    Patient presents with: Chest Pain   Vincent Stout is a 43 y.o. male here for evaluation of chest pain.  Started yesterday evening.  Pleuritic pain to left scapula and chest.  No Shortness of breath.  No numbness or weakness.  Has not gone to his jaw or arm.  No pain or swelling to lower legs.  No history of PE or DVT.  No recent surgery, immobilization or malignancy.  No recent trauma or injury.  He has chronic posterior neck pain followed by Dr. Gillie.  Initially symptoms were due to sleeping wrong last night.  Typically able to complete his ADLs without any exertional chest pain.  Patient was concerned as mother recently passed from MI.   HPI     Prior to Admission medications   Medication Sig Start Date End Date Taking? Authorizing Provider  ibuprofen  (ADVIL ) 800 MG tablet Take 1 tablet (800 mg total) by mouth 3 (three) times daily. 04/25/24  Yes Darlene Bartelt A, PA-C  cephALEXin  (KEFLEX ) 500 MG capsule Take 1 capsule (500 mg total) by mouth 3 (three) times daily. 10/10/23   Geroldine Berg, MD  gabapentin  (NEURONTIN ) 300 MG capsule Take 3 capsules (900 mg total) by mouth 3 (three) times daily. Patient taking differently: Take 300 mg by mouth 3 (three) times daily as needed (neuropathy). 05/07/22 10/06/23  Onita Duos, MD  guaiFENesin (MUCINEX) 600 MG 12 hr tablet Take 1,200 mg by mouth 2 (two) times daily as needed (congestion).    [provider]  oxyCODONE  (OXY IR/ROXICODONE ) 5 MG immediate release tablet Take 5 mg by mouth every 6 (six) hours as needed for severe pain (pain score 7-10). 09/08/23   [provider]    Allergies: Patient has no known allergies.    Review of Systems  Constitutional: Negative.   HENT: Negative.    Respiratory: Negative.    Cardiovascular:  Positive for chest pain (pleuritic).  Gastrointestinal: Negative.    Genitourinary: Negative.   Musculoskeletal: Negative.   Skin: Negative.   Neurological: Negative.   All other systems reviewed and are negative.   Updated Vital Signs BP 128/86   Pulse (!) 56   Temp 98.3 F (36.8 C) (Oral)   Resp 17   Ht 6' (1.829 m)   Wt 86.2 kg   SpO2 100%   BMI 25.77 kg/m   Physical Exam Vitals and nursing note reviewed.  Constitutional:      General: He is not in acute distress.    Appearance: He is well-developed. He is not ill-appearing, toxic-appearing or diaphoretic.  HENT:     Head: Atraumatic.   Eyes:     Pupils: Pupils are equal, round, and reactive to light.    Cardiovascular:     Rate and Rhythm: Normal rate and regular rhythm.     Pulses:          Radial pulses are 2+ on the right side and 2+ on the left side.       Posterior tibial pulses are 2+ on the right side and 2+ on the left side.     Heart sounds: Normal heart sounds.  Pulmonary:     Effort: Pulmonary effort is normal. No respiratory distress.     Breath sounds: Normal breath sounds.     Comments: Clear bilaterally, speaks in full sentences without difficulty Chest:     Comments:  Nontender chest wall, no crepitus or step-off Abdominal:     General: Bowel sounds are normal. There is no distension or abdominal bruit.     Palpations: Abdomen is soft. There is no mass.     Tenderness: There is no abdominal tenderness. There is no guarding or rebound.     Comments: Soft, nontender   Musculoskeletal:        General: Normal range of motion.     Cervical back: Normal range of motion and neck supple.     Right lower leg: No tenderness. No edema.     Left lower leg: No tenderness. No edema.     Comments: No bony tenderness, compartments soft, full range of motion   Skin:    General: Skin is warm and dry.     Capillary Refill: Capillary refill takes less than 2 seconds.     Comments: No edema, erythema, warmth, rashes or lesions on exposed skin   Neurological:     General:  No focal deficit present.     Mental Status: He is alert and oriented to person, place, and time.     (all labs ordered are listed, but only abnormal results are displayed) Labs Reviewed  BASIC METABOLIC PANEL WITH GFR - Abnormal; Notable for the following components:      Result Value   BUN <5 (*)    All other components within normal limits  CBC  D-DIMER, QUANTITATIVE  TROPONIN T, HIGH SENSITIVITY  TROPONIN T, HIGH SENSITIVITY    EKG: None  Radiology: [facility] Chest Port 1 View Result Date: 04/25/2024 CLINICAL DATA:  Left-sided chest pain. EXAM: PORTABLE CHEST 1 VIEW COMPARISON:  12/02/2018 FINDINGS: The cardiomediastinal contours are normal. The lungs are clear. Pulmonary vasculature is normal. No consolidation, pleural effusion, or pneumothorax. No acute osseous abnormalities are seen. IMPRESSION: No active disease. Electronically Signed   By: Andrea Gasman M.D.   On: 04/25/2024 19:21     Procedures   Medications Ordered in the ED - No data to display  43 year old here for evaluation of pleuritic chest pain which started yesterday evening.  No clinical evidence of VTE on exam.  Typically able to do his ADLs without any chest pain or DOE.  Does not appear grossly fluid overloaded.  No URI symptoms.  No recent trauma or injury.  No numbness or weakness.  Will plan on labs, imaging and reassess.  Does not anything for pain at this time  Labs and imaging personally viewed and interpreted:  CBC without leukocytosis Metabolic panel without significant abnormality Trop less than 15 x2 EKG without ischemic changes Ddimer 0.37 Chest x-ray without cardiomegaly, pulm edema, pneumothorax, infiltrate  Discussed results with patient.  Overall reassuring workup.  At this time I have low suspicion for acute ACS, PE, dissection, unstable angina, fluid overload, traumatic injury, radiculopathy, AAA, infectious process, myocarditis, pericarditis, endocarditis  Suspect given pleuritic,  increased with movement likely musculoskeletal in etiology  The patient has been appropriately medically screened and/or stabilized in the ED. I have low suspicion for any other emergent medical condition which would require further screening, evaluation or treatment in the ED or require inpatient management.  Patient is hemodynamically stable and in no acute distress.  Patient able to ambulate in department prior to ED.  Evaluation does not show acute pathology that would require ongoing or additional emergent interventions while in the emergency department or further inpatient treatment.  I have discussed the diagnosis with the patient and answered all questions.  Pain is been managed while in the emergency department and patient has no further complaints prior to discharge.  Patient is comfortable with plan discussed in room and is stable for discharge at this time.  I have discussed strict return precautions for returning to the emergency department.  Patient was encouraged to follow-up with PCP/specialist refer to at discharge.                                   Medical Decision Making Amount and/or Complexity of Data Reviewed External Data Reviewed: labs, radiology, ECG and notes. Labs: ordered. Decision-making details documented in ED Course. Radiology: ordered and independent interpretation performed. Decision-making details documented in ED Course. ECG/medicine tests: ordered and independent interpretation performed. Decision-making details documented in ED Course.  Risk OTC drugs. Decision regarding hospitalization. Diagnosis or treatment significantly limited by social determinants of health.       Final diagnoses:  Precordial pain    ED Discharge Orders          Ordered    ibuprofen  (ADVIL ) 800 MG tablet  3 times daily        04/25/24 2102               Zaakirah Kistner A, PA-C 04/25/24 2104    Patsey Lot, MD 04/25/24 2314

## 2024-04-25 NOTE — ED Triage Notes (Signed)
 Pt POV reporting L side chest pain that began yesterday, radiates to back, worsens when taking a deep breath.

## 2024-04-25 NOTE — Discharge Instructions (Addendum)
 It was a pleasure taking care of you today.  Your work up today is reassuring.  I have started you on some anti-inflammatories, in case this is musculoskeletal in nature.  Take as prescribed.  Make sure to follow-up outpatient, return if any worsening symptoms

## 2024-04-25 NOTE — ED Notes (Signed)
Reviewed discharge instructions, medications, and home care with pt. Pt verbalized understanding and had no further questions. Pt exited ED without complications.

## 2024-04-28 ENCOUNTER — Emergency Department (HOSPITAL_BASED_OUTPATIENT_CLINIC_OR_DEPARTMENT_OTHER)

## 2024-04-28 ENCOUNTER — Other Ambulatory Visit: Payer: Self-pay

## 2024-04-28 ENCOUNTER — Emergency Department (HOSPITAL_BASED_OUTPATIENT_CLINIC_OR_DEPARTMENT_OTHER): Admission: EM | Admit: 2024-04-28 | Discharge: 2024-04-28 | Disposition: A | Discharge status: Home / Self Care

## 2024-04-28 DIAGNOSIS — R0789 Other chest pain: Secondary | ICD-10-CM | POA: Insufficient documentation

## 2024-04-28 DIAGNOSIS — M549 Dorsalgia, unspecified: Secondary | ICD-10-CM | POA: Diagnosis not present

## 2024-04-28 DIAGNOSIS — M546 Pain in thoracic spine: Secondary | ICD-10-CM | POA: Diagnosis not present

## 2024-04-28 DIAGNOSIS — R079 Chest pain, unspecified: Secondary | ICD-10-CM | POA: Diagnosis not present

## 2024-04-28 LAB — BASIC METABOLIC PANEL WITH GFR
Anion gap: 10 (ref 5–15)
BUN: <5 mg/dL — ABNORMAL LOW (ref 6–20)
CO2: 26 mmol/L (ref 22–32)
Calcium: 9 mg/dL (ref 8.9–10.3)
Chloride: 102 mmol/L (ref 98–111)
Creatinine, Ser: 0.99 mg/dL (ref 0.61–1.24)
GFR, Estimated: >60 mL/min (ref >60)
Glucose, Bld: 93 mg/dL (ref 70–99)
Potassium: 3.9 mmol/L (ref 3.5–5.1)
Sodium: 139 mmol/L (ref 135–145)

## 2024-04-28 LAB — CBC
HCT: 41.5 % (ref 39.0–52.0)
Hemoglobin: 14.6 g/dL (ref 13.0–17.0)
MCH: 30.8 pg (ref 26.0–34.0)
MCHC: 35.2 g/dL (ref 30.0–36.0)
MCV: 87.6 fL (ref 80.0–100.0)
Platelets: 214 10*3/uL (ref 150–400)
RBC: 4.74 MIL/uL (ref 4.22–5.81)
RDW: 13.2 % (ref 11.5–15.5)
WBC: 11.8 10*3/uL — ABNORMAL HIGH (ref 4.0–10.5)
nRBC: 0 % (ref 0.0–0.2)

## 2024-04-28 LAB — TROPONIN T, HIGH SENSITIVITY
Troponin T High Sensitivity: <15 ng/L (ref <19)
Troponin T High Sensitivity: <15 ng/L (ref <19)

## 2024-04-28 MED ORDER — LIDOCAINE 5 % EX PTCH
1.0000 | MEDICATED_PATCH | Freq: Once | CUTANEOUS | Status: DC
  Administered 2024-04-28: 1 via TRANSDERMAL
  Filled 2024-04-28: qty 1

## 2024-04-28 MED ORDER — LIDOCAINE 5 % EX PTCH: 1.0000 | MEDICATED_PATCH | CUTANEOUS | 0 refills | Status: AC

## 2024-04-28 MED ORDER — METHOCARBAMOL 500 MG PO TABS
500.0000 mg | ORAL_TABLET | Freq: Once | ORAL | Status: AC
Start: 2024-04-28 — End: 2024-04-28
  Administered 2024-04-28: 500 mg via ORAL
  Filled 2024-04-28: qty 1

## 2024-04-28 MED ORDER — OXYCODONE-ACETAMINOPHEN 5-325 MG PO TABS
1.0000 | ORAL_TABLET | Freq: Once | ORAL | Status: AC
  Administered 2024-04-28: 1 via ORAL
  Filled 2024-04-28: qty 1

## 2024-04-28 MED ORDER — METHOCARBAMOL 500 MG PO TABS: 500.0000 mg | ORAL_TABLET | Freq: Two times a day (BID) | ORAL | 0 refills | Status: AC | PRN

## 2024-04-28 MED ORDER — KETOROLAC TROMETHAMINE 15 MG/ML IJ SOLN
15.0000 mg | Freq: Once | INTRAMUSCULAR | Status: AC
  Administered 2024-04-28: 15 mg via INTRAVENOUS
  Filled 2024-04-28: qty 1

## 2024-04-28 NOTE — ED Notes (Signed)
 Patient was angry when he was discharged saying he (the doctor) didn't have to act like I am just here for drugs.  I was here for this already.  I have a quarter million dollars and if I wanted drugs I could just go buy them on the street. If I gotta come back tomorrow because this pain isn't better I'm gonna cuss him the fuck out... If y'all gotta call the cops that's fine, I can make bail but I am going to cuss him the fuck out.

## 2024-04-28 NOTE — Discharge Instructions (Addendum)
 You may take over-the-counter medications Tylenol  alternating with ibuprofen .  We are prescribing you lidocaine  patches and muscle relaxers.  Please follow-up with your primary doctor.  Have also given you the number to the orthopedic doctors that you can follow-up with as well.

## 2024-04-28 NOTE — ED Provider Notes (Signed)
 [place] EMERGENCY DEPARTMENT AT [facility] Provider Note   CSN: [id number] Arrival date & time: 04/28/24  1743     Patient presents with: Chest Pain   Vincent Stout is a 43 y.o. male.   Is a 43 year old male presenting emergency department with left chest pain and upper back pain behind the left scapula.  Symptoms started Saturday after he lifted up his child.  Has been intermittent.  Described as sharp.  Worsened with movement.  Better with rest.  Notes it feels like a spasm.  No associated shortness of breath.  No nausea vomiting.   Chest Pain      Prior to Admission medications   Medication Sig Start Date End Date Taking? Authorizing Provider  lidocaine  (LIDODERM ) 5 % Place 1 patch onto the skin daily. Remove & Discard patch within 12 hours or as directed by MD 04/28/24  Yes Neysa Caron PARAS, DO  methocarbamol  (ROBAXIN ) 500 MG tablet Take 1 tablet (500 mg total) by mouth 2 (two) times daily as needed for up to 7 days for muscle spasms. 04/28/24 05/05/24 Yes Chirstina Haan, Caron PARAS, DO  cephALEXin  (KEFLEX ) 500 MG capsule Take 1 capsule (500 mg total) by mouth 3 (three) times daily. 10/10/23   Geroldine Berg, MD  gabapentin  (NEURONTIN ) 300 MG capsule Take 3 capsules (900 mg total) by mouth 3 (three) times daily. Patient taking differently: Take 300 mg by mouth 3 (three) times daily as needed (neuropathy). 05/07/22 10/06/23  Onita Duos, MD  guaiFENesin (MUCINEX) 600 MG 12 hr tablet Take 1,200 mg by mouth 2 (two) times daily as needed (congestion).    [provider]  ibuprofen  (ADVIL ) 800 MG tablet Take 1 tablet (800 mg total) by mouth 3 (three) times daily. 04/25/24   Henderly, Britni A, PA-C  oxyCODONE  (OXY IR/ROXICODONE ) 5 MG immediate release tablet Take 5 mg by mouth every 6 (six) hours as needed for severe pain (pain score 7-10). 09/08/23   [provider]    Allergies: Patient has no known allergies.    Review of Systems  Cardiovascular:  Positive for chest  pain.    Updated Vital Signs BP (!) 144/100   Pulse 67   Temp 99 F (37.2 C)   Resp 15   SpO2 100%   Physical Exam Vitals and nursing note reviewed.  Constitutional:      General: He is not in acute distress.    Appearance: He is not toxic-appearing.  Cardiovascular:     Rate and Rhythm: Normal rate and regular rhythm.     Pulses:          Radial pulses are 2+ on the right side and 2+ on the left side.       Dorsalis pedis pulses are 2+ on the right side and 2+ on the left side.     Heart sounds: Normal heart sounds.  Pulmonary:     Effort: Pulmonary effort is normal.     Breath sounds: Normal breath sounds.  Musculoskeletal:        General: Normal range of motion.  Skin:    General: Skin is warm.     Capillary Refill: Capillary refill takes less than 2 seconds.  Neurological:     Mental Status: He is alert and oriented to person, place, and time.  Psychiatric:        Mood and Affect: Mood normal.        Behavior: Behavior normal.     (all labs ordered are listed,  but only abnormal results are displayed) Labs Reviewed  BASIC METABOLIC PANEL WITH GFR - Abnormal; Notable for the following components:      Result Value   BUN <5 (*)    All other components within normal limits  CBC - Abnormal; Notable for the following components:   WBC 11.8 (*)    All other components within normal limits  TROPONIN T, HIGH SENSITIVITY  TROPONIN T, HIGH SENSITIVITY    EKG: None  Radiology: DG Chest 2 View Result Date: 04/28/2024 CLINICAL DATA:  Chest and back pain EXAM: CHEST - 2 VIEW COMPARISON:  04/25/2024 FINDINGS: The heart size and mediastinal contours are within normal limits. Both lungs are clear. The visualized skeletal structures are unremarkable. IMPRESSION: No active cardiopulmonary disease. Electronically Signed   By: Norman Gatlin M.D.   On: 04/28/2024 19:30     Procedures   Medications Ordered in the ED  lidocaine  (LIDODERM ) 5 % 1 patch (1 patch Transdermal  Patch Applied 04/28/24 1858)  ketorolac  (TORADOL ) 15 MG/ML injection 15 mg (15 mg Intravenous Given 04/28/24 1858)  methocarbamol  (ROBAXIN ) tablet 500 mg (500 mg Oral Given 04/28/24 1858)  oxyCODONE -acetaminophen  (PERCOCET/ROXICET) 5-325 MG per tablet 1 tablet (1 tablet Oral Given 04/28/24 2031)  oxyCODONE -acetaminophen  (PERCOCET/ROXICET) 5-325 MG per tablet 1 tablet (1 tablet Oral Given 04/28/24 2242)    Clinical Course as of 04/28/24 2308  Wed Apr 28, 2024  1931 ED EKG EKG on my independent interpretation normal sinus rhythm at a rate of 60 bpm.  Normal intervals.  No QTc prolongation.  No ST segment changes to indicate ischemia. [TY]    Clinical Course User Index [TY] Neysa Caron PARAS, DO                                 Medical Decision Making This is a 43 year old male with chronic back pain who is living in his car presented the emergency department for left-sided chest pain and left scapular pain.  He is afebrile nontachycardic hemodynamically stable.  He has clear lungs, equal pulses.  Does not appear to be in distress.  His pain is quite reproducible with palpation.  Worsened with movement.  Agree with prior assessment, as per chart review he was seen a couple days ago diagnosed with MSK pain.  His EKG similar to prior with no ST segment changes to indicate ischemia on my independent interpretation.  His troponin negative.  No significant metabolic derangements.  Nonspecific leukocytosis without evidence of infection.  Chest x-ray without pneumonia pneumothorax.  No widened mediastinum or unequal pulses to suggest dissection.  Was treated with multimodal pain medication Toradol , Robaxin  and Percocet.  Discussed follow-up with primary doctor.  Stable for discharge.  Amount and/or Complexity of Data Reviewed ECG/medicine tests:  Decision-making details documented in ED Course.  Risk Prescription drug management.      Final diagnoses:  Atypical chest pain    ED Discharge Orders           Ordered    lidocaine  (LIDODERM ) 5 %  Every 24 hours        04/28/24 2217    methocarbamol  (ROBAXIN ) 500 MG tablet  2 times daily PRN        04/28/24 2217               Neysa Caron PARAS, DO 04/28/24 2308

## 2024-04-28 NOTE — ED Triage Notes (Signed)
 Recurrent chest pain , pain starts from right mid back pain . Pain worse with movement .  Pt upset that pain came back .

## 2024-04-29 ENCOUNTER — Emergency Department (HOSPITAL_COMMUNITY)

## 2024-04-29 ENCOUNTER — Other Ambulatory Visit: Payer: Self-pay

## 2024-04-29 ENCOUNTER — Emergency Department (HOSPITAL_BASED_OUTPATIENT_CLINIC_OR_DEPARTMENT_OTHER)
Admission: EM | Admit: 2024-04-29 | Discharge: 2024-04-29 | Discharge status: Against Medical Advice | Attending: Emergency Medicine | Admitting: Emergency Medicine

## 2024-04-29 ENCOUNTER — Encounter (HOSPITAL_COMMUNITY): Payer: Self-pay

## 2024-04-29 DIAGNOSIS — R29898 Other symptoms and signs involving the musculoskeletal system: Secondary | ICD-10-CM

## 2024-04-29 DIAGNOSIS — M5416 Radiculopathy, lumbar region: Secondary | ICD-10-CM | POA: Diagnosis not present

## 2024-04-29 DIAGNOSIS — R531 Weakness: Secondary | ICD-10-CM | POA: Diagnosis present

## 2024-04-29 DIAGNOSIS — M5126 Other intervertebral disc displacement, lumbar region: Secondary | ICD-10-CM | POA: Diagnosis not present

## 2024-04-29 DIAGNOSIS — M549 Dorsalgia, unspecified: Secondary | ICD-10-CM | POA: Insufficient documentation

## 2024-04-29 DIAGNOSIS — M5134 Other intervertebral disc degeneration, thoracic region: Secondary | ICD-10-CM | POA: Diagnosis not present

## 2024-04-29 DIAGNOSIS — G459 Transient cerebral ischemic attack, unspecified: Secondary | ICD-10-CM | POA: Diagnosis not present

## 2024-04-29 DIAGNOSIS — M47816 Spondylosis without myelopathy or radiculopathy, lumbar region: Secondary | ICD-10-CM | POA: Diagnosis not present

## 2024-04-29 DIAGNOSIS — M47814 Spondylosis without myelopathy or radiculopathy, thoracic region: Secondary | ICD-10-CM | POA: Diagnosis not present

## 2024-04-29 LAB — CBC WITH DIFFERENTIAL/PLATELET
Abs Immature Granulocytes: 0.02 10*3/uL (ref 0.00–0.07)
Basophils Absolute: 0 10*3/uL (ref 0.0–0.1)
Basophils Relative: 0 %
Eosinophils Absolute: 0.1 10*3/uL (ref 0.0–0.5)
Eosinophils Relative: 1 %
HCT: 41.6 % (ref 39.0–52.0)
Hemoglobin: 14.3 g/dL (ref 13.0–17.0)
Immature Granulocytes: 0 %
Lymphocytes Relative: 43 %
Lymphs Abs: 4 10*3/uL (ref 0.7–4.0)
MCH: 29.9 pg (ref 26.0–34.0)
MCHC: 34.4 g/dL (ref 30.0–36.0)
MCV: 87 fL (ref 80.0–100.0)
Monocytes Absolute: 0.7 10*3/uL (ref 0.1–1.0)
Monocytes Relative: 7 %
Neutro Abs: 4.4 10*3/uL (ref 1.7–7.7)
Neutrophils Relative %: 49 %
Platelets: 215 10*3/uL (ref 150–400)
RBC: 4.78 MIL/uL (ref 4.22–5.81)
RDW: 13.2 % (ref 11.5–15.5)
WBC: 9.1 10*3/uL (ref 4.0–10.5)
nRBC: 0 % (ref 0.0–0.2)

## 2024-04-29 LAB — COMPREHENSIVE METABOLIC PANEL WITH GFR
ALT: 13 U/L (ref 0–44)
AST: 16 U/L (ref 15–41)
Albumin: 4.4 g/dL (ref 3.5–5.0)
Alkaline Phosphatase: 50 U/L (ref 38–126)
Anion gap: 10 (ref 5–15)
BUN: <5 mg/dL — ABNORMAL LOW (ref 6–20)
CO2: 26 mmol/L (ref 22–32)
Calcium: 9.1 mg/dL (ref 8.9–10.3)
Chloride: 105 mmol/L (ref 98–111)
Creatinine, Ser: 0.83 mg/dL (ref 0.61–1.24)
GFR, Estimated: >60 mL/min (ref >60)
Glucose, Bld: 100 mg/dL — ABNORMAL HIGH (ref 70–99)
Potassium: 3.4 mmol/L — ABNORMAL LOW (ref 3.5–5.1)
Sodium: 141 mmol/L (ref 135–145)
Total Bilirubin: 0.5 mg/dL (ref 0.0–1.2)
Total Protein: 6.7 g/dL (ref 6.5–8.1)

## 2024-04-29 LAB — MAGNESIUM: Magnesium: 2.2 mg/dL (ref 1.7–2.4)

## 2024-04-29 LAB — CK: Total CK: 130 U/L (ref 49–397)

## 2024-04-29 MED ORDER — OXYCODONE-ACETAMINOPHEN 5-325 MG PO TABS
1.0000 | ORAL_TABLET | Freq: Once | ORAL | Status: AC
  Administered 2024-04-29: 1 via ORAL
  Filled 2024-04-29: qty 1

## 2024-04-29 MED ORDER — KETOROLAC TROMETHAMINE 15 MG/ML IJ SOLN
15.0000 mg | Freq: Once | INTRAMUSCULAR | Status: AC
  Administered 2024-04-29: 15 mg via INTRAVENOUS
  Filled 2024-04-29: qty 1

## 2024-04-29 MED ORDER — POTASSIUM CHLORIDE CRYS ER 20 MEQ PO TBCR
40.0000 meq | EXTENDED_RELEASE_TABLET | Freq: Once | ORAL | Status: AC
  Administered 2024-04-29: 40 meq via ORAL
  Filled 2024-04-29: qty 2

## 2024-04-29 MED ORDER — SODIUM CHLORIDE 0.9 % IV BOLUS
1000.0000 mL | Freq: Once | INTRAVENOUS | Status: AC
  Administered 2024-04-29: 1000 mL via INTRAVENOUS

## 2024-04-29 MED ORDER — LACTATED RINGERS IV BOLUS
1000.0000 mL | Freq: Once | INTRAVENOUS | Status: AC
  Administered 2024-04-29: 1000 mL via INTRAVENOUS

## 2024-04-29 MED ORDER — CYCLOBENZAPRINE HCL 10 MG PO TABS: 10.0000 mg | ORAL_TABLET | Freq: Two times a day (BID) | ORAL | 0 refills | Status: DC | PRN

## 2024-04-29 NOTE — ED Provider Notes (Signed)
  Physical Exam  BP 139/74   Pulse 60   Temp 99.6 F (37.6 C)   Resp 17   SpO2 100%   Physical Exam  Procedures  Procedures  ED Course / MDM   Clinical Course as of 04/30/24 0056  Thu Apr 29, 2024  1843 Called MR to inform them that patient in [facility] triage. Not claustrophobic or needing sedation prior to MR. [OZ]    Clinical Course User Index [OZ] Cecily Legrand LABOR, PA-C   Medical Decision Making Amount and/or Complexity of Data Reviewed Labs: ordered. Radiology: ordered.  Risk Prescription drug management.   Patient returned from MRI.  Is still having right lower extremity weakness without numbness.  States he is not had any bowel or bladder incontinence at all.  Patellar and Achilles reflexes 2+.  No ankle clonus.  4/5 right hip flexion, 2/5 knee flexion and extension, 2/5 ankle dorsiflexion and plantarflexion.  No decrease sensation to light touch.  MRI of the brain without acute findings.  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  Suspect this is likely the cause of his symptoms.  Given the significant weakness did recommend that he stay for neurosurgery consultation but the patient stated that he had to leave to do something very important.  Does understand that these deficits could be permanent.  Still wishes to go and do feel he has capacity make this decision.  Return precautions discussed prior to discharge.  Will have him follow-up with his neurosurgeon as an outpatient.       Yolande Lamar BROCKS, MD 04/30/24 [phone]

## 2024-04-29 NOTE — ED Triage Notes (Signed)
 Pt returning to the ED reporting he can not move right leg. Patient reports it was last known normal when he showed up to the ED last night at 1743. Patient reporting he could not use his right leg when he was discharged but he was put in the car. Patient thought it would get better but it has not. Patient unable to move leg, foot or toes in triage.

## 2024-04-29 NOTE — ED Triage Notes (Signed)
 PT arrives via POV as a transfer from [facility]. Pt was sent here to have an MRI. PT reports chronic lower back pain. States has been experiencing numbness and weakness to right leg since yesterday around 1800. Pt AxOx4.

## 2024-04-29 NOTE — ED Notes (Signed)
 Pt was instructed to go POV to [facility] for an MRI. The pt had a male friend who can drive him without risk. The male understood to take him directly to [facility] and not to stop for anything other than fuel. They understood to call 911 for any events or emergencies and had his paperwork in a confidential packet.   He ambulated without assistance, utilizing a rolling walker for safety, and walked from room 8 to the PVA.

## 2024-04-29 NOTE — ED Provider Notes (Cosign Needed Addendum)
 [place] [facility] Provider Note   CSN: [id number] Arrival date & time: 04/29/24  1131     Patient presents with: Extremity Weakness   Vincent Stout is a 43 y.o. male with history of chronic back pain and chronic neck pain presents to the emergency department today for evaluation of right leg weakness.  Patient was recently seen in the ER last night and reports that he felt like he was weak in both of his legs, but attributed it to the muscle relaxer that he was given.  He reports that whenever he had arrived home around midnight today, he had trouble moving his right leg.  Patient has conflicting history.  He initially reported that he was not able to feel his leg, however then reported that he did not have any numbness or tingling.  He reports that he usually has a limp because of his chronic back pain but he feels that he is unable to move his foot and it is held at a dorsiflexed position. Reports that his whole leg feels locked.  He denies any fall or recent trauma.  He uses to have back surgery in December 2024 however this was canceled.  Patient reports that he uses to have 2 teeth removed however has been delaying this as he would like to be able to buy himself a set of teeth and has been waiting for the money.  He denies any saddle anesthesia, fecal or urinary incontinence, dysuria, fevers, or any history of IV drug use ever.  He denies any new back pain or pain into his extremity.  No known drug allergies.  Reports marijuana use, but denies any IV drug use ever.   Extremity Weakness Pertinent negatives include no chest pain and no shortness of breath.       Prior to Admission medications   Medication Sig Start Date End Date Taking? Authorizing Provider  cephALEXin  (KEFLEX ) 500 MG capsule Take 1 capsule (500 mg total) by mouth 3 (three) times daily. 10/10/23   Geroldine Berg, MD  gabapentin  (NEURONTIN ) 300 MG capsule Take 3 capsules (900 mg  total) by mouth 3 (three) times daily. Patient taking differently: Take 300 mg by mouth 3 (three) times daily as needed (neuropathy). 05/07/22 10/06/23  Onita Duos, MD  guaiFENesin (MUCINEX) 600 MG 12 hr tablet Take 1,200 mg by mouth 2 (two) times daily as needed (congestion).    [provider]  ibuprofen  (ADVIL ) 800 MG tablet Take 1 tablet (800 mg total) by mouth 3 (three) times daily. 04/25/24   Henderly, Britni A, PA-C  lidocaine  (LIDODERM ) 5 % Place 1 patch onto the skin daily. Remove & Discard patch within 12 hours or as directed by MD 04/28/24   Neysa Caron PARAS, DO  methocarbamol  (ROBAXIN ) 500 MG tablet Take 1 tablet (500 mg total) by mouth 2 (two) times daily as needed for up to 7 days for muscle spasms. 04/28/24 05/05/24  Neysa Caron PARAS, DO  oxyCODONE  (OXY IR/ROXICODONE ) 5 MG immediate release tablet Take 5 mg by mouth every 6 (six) hours as needed for severe pain (pain score 7-10). 09/08/23   [provider]    Allergies: Patient has no known allergies.    Review of Systems  Constitutional:  Negative for chills and fever.  Respiratory:  Negative for shortness of breath.   Cardiovascular:  Negative for chest pain.  Gastrointestinal:  Negative for abdominal distention.       Denies any fecal incontinence  Genitourinary:  Negative for dysuria and hematuria.       Denies any urinary incontinence or urinary retention.  Musculoskeletal:  Positive for back pain and extremity weakness.       Denies any saddle anesthesia  Neurological:  Positive for weakness. Negative for numbness.    Updated Vital Signs BP 137/88   Pulse 65   Temp 98.9 F (37.2 C) (Oral)   Resp 18   SpO2 100%   Physical Exam Vitals and nursing note reviewed.  Constitutional:      General: He is not in acute distress.    Appearance: He is not ill-appearing or toxic-appearing.     Comments: On phone in no acute distress.  Eyes:     General: No scleral icterus. Pulmonary:     Effort: Pulmonary effort  is normal. No respiratory distress.  Musculoskeletal:     Right lower leg: No edema.     Left lower leg: No edema.     Comments: Patient is able to lift his leg off the bed and hold it straight reporting he cannot bend his knee.  Knee is able to be passively flexed and extended by provider.  Foot is held in a dorsiflexed position however patient does spontaneously move his toes.  His compartments are soft.  No lower leg edema or swelling noted.  Temperature, coloration, and size appear and feel symmetric bilaterally.  He has palpable DP and PT pulses.  He withdraws to painful stimuli.  Skin:    General: Skin is warm and dry.  Neurological:     Mental Status: He is alert.     Sensory: No sensory deficit.     (all labs ordered are listed, but only abnormal results are displayed) Labs Reviewed  COMPREHENSIVE METABOLIC PANEL WITH GFR - Abnormal; Notable for the following components:      Result Value   Potassium 3.4 (*)    Glucose, Bld 100 (*)    BUN <5 (*)    All other components within normal limits  CBC WITH DIFFERENTIAL/PLATELET  CK  MAGNESIUM    EKG: None  Radiology: DG Chest 2 View Result Date: 04/28/2024 CLINICAL DATA:  Chest and back pain EXAM: CHEST - 2 VIEW COMPARISON:  04/25/2024 FINDINGS: The heart size and mediastinal contours are within normal limits. Both lungs are clear. The visualized skeletal structures are unremarkable. IMPRESSION: No active cardiopulmonary disease. Electronically Signed   By: Norman Gatlin M.D.   On: 04/28/2024 19:30    Procedures   Medications Ordered in the ED  ketorolac  (TORADOL ) 15 MG/ML injection 15 mg (15 mg Intravenous Given 04/29/24 1331)  sodium chloride  0.9 % bolus 1,000 mL (0 mLs Intravenous Stopped 04/29/24 1458)  lactated ringers  bolus 1,000 mL (1,000 mLs Intravenous New Bag/Given 04/29/24 1459)  potassium chloride SA (KLOR-CON M) CR tablet 40 mEq (40 mEq Oral Given 04/29/24 1526)    Medical Decision Making Amount and/or Complexity of  Data Reviewed Labs: ordered.  Risk Prescription drug management.   43 y.o. male presents to the ER for evaluation of right lower leg weakness. Differential diagnosis includes but is not limited to stroke, cauda equina, central cord syndrome, psychogenic. Vital signs unremarkable. Physical exam as noted above.   Patient gives a conflicting history.  He reports the weakness was new, but then reports that he walks with a limp at baseline because of his chronic back pain.  I also see documentation of right leg paralysis mention under his medical history on snapshot.  The patient  has a conflicting physical examination as well.  He reports that he is unable to move his leg however holds his leg completely straight/rigid and is able to lift it off the bed.  He was then able to actively bend his knee towards his chest, but reports that he was unable to do this.  He was also moving his toes, but reports that they were doing that by themselves and he was not controlling.  He was withdrawing his foot to pain after being poked with the sharper end of a Q-tip and withdrew his foot the pain.  His foot is held in a dorsiflexed position but his toes do move.  He is neurovascularly intact distally with palpable pulses and soft compartments.  He denies any new trauma or any back red flag symptoms such as saddle anesthesia, fecal or urinary incontinence, fever, or IV drug use ever.  My attending assessed the patient at bedside.  Recommends a basic labs and fluids for now and will reassess.  I independently reviewed and interpreted the patient's labs.  CBC shows no leukocytosis or anemia.  CK within normal limits.  Magnesium within normal limits.  Potassium mildly decreased at 3.4.  Glucose at 100 with a BUN less than 5.  No other electrolyte or LFT abnormality.  On reevaluation, patient reports that he feels that he is able to move his knee more towards his chest and is actively doing this in the room without  assistance.  He is still moving his toes however the patient reports that he has no control over that.  With nursing staff, patient was able to ambulate with a walker however felt he had trouble picking up his leg to put it in front of his other.  Discussed with attending, plan is for MRI of the brain, thoracic, and lumbar spine to rule out any stroke or spinal cord abnormality causing the symptoms.  I discussed plan with patient and friend at bedside.  Patient would like to go POV.  He reports that he has had MRIs before.  Denies any retained metal bodies.  Denies any claustrophobia.  I have called [facility] for ER to ER transfer and spoke with Dr. Francesca who accepts the patient.   Nonemergent transfer was offered to patient again however he declines and would like to go POV.  Discussed having traffic walls and speed limits and wearing seatbelt.  Discussed he has any new or worsening symptoms to pull over and call 911 immediately.  He was instructed to go directly to [facility] emergency department.  I discussed this case with my attending physician who cosigned this note including patient's presenting symptoms, physical exam, and planned diagnostics and interventions. Attending physician stated agreement with plan or made changes to plan which were implemented.   Attending physician assessed patient at bedside.  Portions of this report may have been transcribed using voice recognition software. Every effort was made to ensure accuracy; however, inadvertent computerized transcription errors may be present.    Final diagnoses:  Right leg weakness    ED Discharge Orders     None          Bernis Ernst, PA-C 04/29/24 1730    Bernis Ernst, PA-C 04/29/24 1730

## 2024-04-29 NOTE — ED Notes (Signed)
 Walked the patient about 3ft using a rolling walker without incident. The pt advised his L knee can bend but he feels like his L hamstring lacks strength and stability. The pt denied recent trauma or new pain, and advised it's just localized weakness. No lower back pain, no saddle numbness, no incontinence noted.

## 2024-04-29 NOTE — Discharge Instructions (Addendum)
 You were seen for your leg weakness in the emergency department.   At home, please use over-the-counter Tylenol , ibuprofen , and lidocaine  patches.  You may also use the cyclobenzaprine  we have prescribed you as needed for pain.  Do not take the cyclobenzaprine  before driving or operating heavy machinery.  Follow-up with your primary doctor in 2-3 days regarding your visit.  Follow-up with the spine clinic as soon as possible regarding your symptoms.  Return immediately to the emergency department if you experience any of the following: Numbness or weakness of your legs, bowel or bladder incontinence, numbness while wiping after pooping or urinating, or any other concerning symptoms.    Thank you for visiting our Emergency Department. It was a pleasure taking care of you today.

## 2024-04-29 NOTE — ED Notes (Signed)
 Pt leaving AMA. MD at bedside to explain risks and benefits. Pt in NAD at this time

## 2024-07-23 ENCOUNTER — Other Ambulatory Visit: Payer: Self-pay

## 2024-07-23 ENCOUNTER — Emergency Department (HOSPITAL_BASED_OUTPATIENT_CLINIC_OR_DEPARTMENT_OTHER)
Admission: EM | Admit: 2024-07-23 | Discharge: 2024-07-23 | Disposition: A | Discharge status: Home / Self Care | Payer: Self-pay | Attending: Emergency Medicine | Admitting: Emergency Medicine

## 2024-07-23 ENCOUNTER — Encounter (HOSPITAL_BASED_OUTPATIENT_CLINIC_OR_DEPARTMENT_OTHER): Payer: Self-pay | Admitting: Emergency Medicine

## 2024-07-23 DIAGNOSIS — T1502XA Foreign body in cornea, left eye, initial encounter: Secondary | ICD-10-CM | POA: Diagnosis not present

## 2024-07-23 DIAGNOSIS — H578A2 Foreign body sensation, left eye: Secondary | ICD-10-CM | POA: Insufficient documentation

## 2024-07-23 MED ORDER — TETRACAINE HCL 0.5 % OP SOLN
2.0000 [drp] | Freq: Once | OPHTHALMIC | Status: AC
  Administered 2024-07-23: 2 [drp] via OPHTHALMIC
  Filled 2024-07-23: qty 4

## 2024-07-23 MED ORDER — POLYMYXIN B-TRIMETHOPRIM 10000-0.1 UNIT/ML-% OP SOLN: 1.0000 [drp] | OPHTHALMIC | 0 refills | Status: AC

## 2024-07-23 MED ORDER — FLUORESCEIN SODIUM 1 MG OP STRP
1.0000 | ORAL_STRIP | Freq: Once | OPHTHALMIC | Status: AC
  Administered 2024-07-23: 1 via OPHTHALMIC
  Filled 2024-07-23: qty 1

## 2024-07-23 NOTE — ED Provider Notes (Signed)
 [place] [facility] Provider Note   CSN: [id number] Arrival date & time: 07/23/24  2034     Patient presents with: Foreign Body in Eye   Vincent Stout is a 43 y.o. male.   24 yoM with a chief complaints of left eye pain.  He said last night as he was going to bed he felt like something maybe got into his eye.  Woke up this morning and felt like things got a bit worse.  He denies any woodworking or welding or metal grinding.  Denies contact lens use.  Denies any trouble with the vision in his eye.  He has been rubbing it and has had some watering of the eye.   Foreign Body in Eye       Prior to Admission medications   Medication Sig Start Date End Date Taking? Authorizing Provider  trimethoprim -polymyxin b  (POLYTRIM ) ophthalmic solution Place 1 drop into the right eye every 4 (four) hours. 07/23/24  Yes Emil Share, DO  cephALEXin  (KEFLEX ) 500 MG capsule Take 1 capsule (500 mg total) by mouth 3 (three) times daily. 10/10/23   Geroldine Berg, MD  gabapentin  (NEURONTIN ) 300 MG capsule Take 3 capsules (900 mg total) by mouth 3 (three) times daily. Patient taking differently: Take 300 mg by mouth 3 (three) times daily as needed (neuropathy). 05/07/22 10/06/23  Onita Duos, MD  guaiFENesin (MUCINEX) 600 MG 12 hr tablet Take 1,200 mg by mouth 2 (two) times daily as needed (congestion).    [provider]  ibuprofen  (ADVIL ) 800 MG tablet Take 1 tablet (800 mg total) by mouth 3 (three) times daily. 04/25/24   Henderly, Britni A, PA-C  lidocaine  (LIDODERM ) 5 % Place 1 patch onto the skin daily. Remove & Discard patch within 12 hours or as directed by MD 04/28/24   Neysa Caron PARAS, DO  oxyCODONE  (OXY IR/ROXICODONE ) 5 MG immediate release tablet Take 5 mg by mouth every 6 (six) hours as needed for severe pain (pain score 7-10). 09/08/23   [provider]    Allergies: Patient has no known allergies.    Review of Systems  Updated Vital  Signs BP (!) 138/92   Pulse 76   Temp 98.5 F (36.9 C)   Resp 15   Ht 6' (1.829 m)   SpO2 99%   BMI 25.77 kg/m   Physical Exam Vitals and nursing note reviewed.  Constitutional:      Appearance: He is well-developed.  HENT:     Head: Normocephalic and atraumatic.  Eyes:     Extraocular Movements: Extraocular movements intact.     Pupils: Pupils are equal, round, and reactive to light.     Comments: No obvious foreign body, extraocular motion intact.  Pupil equal round and reactive to light and accommodation.  No fluorescein  uptake.  Patient deferred Tono-Pen  Neck:     Vascular: No JVD.  Cardiovascular:     Rate and Rhythm: Normal rate and regular rhythm.     Heart sounds: No murmur heard.    No friction rub. No gallop.  Pulmonary:     Effort: No respiratory distress.     Breath sounds: No wheezing.  Abdominal:     General: There is no distension.     Tenderness: There is no abdominal tenderness. There is no guarding or rebound.  Musculoskeletal:        General: Normal range of motion.     Cervical back: Normal range of motion and neck  supple.  Skin:    Coloration: Skin is not pale.     Findings: No rash.  Neurological:     Mental Status: He is alert and oriented to person, place, and time.  Psychiatric:        Behavior: Behavior normal.     (all labs ordered are listed, but only abnormal results are displayed) Labs Reviewed - No data to display  EKG: None  Radiology: No results found.   Procedures   Medications Ordered in the ED  tetracaine  (PONTOCAINE) 0.5 % ophthalmic solution 2 drop (2 drops Left Eye Given 07/23/24 2054)  fluorescein  ophthalmic strip 1 strip (1 strip Left Eye Given 07/23/24 2054)                                    Medical Decision Making Risk Prescription drug management.   43 yo M with a chief complaint of left eye pain.  This started last night he felt like maybe he got something in the eye and then woke up this morning and  had persistent symptoms.  He has had no trouble with his vision.  I do not appreciate any obvious foreign body or corneal abrasion.  I did discuss rationale for Tono-Pen which the patient is currently declining.  I do think it is less likely to be acute angle-closure glaucoma with a reactive pupil and has no vision change.  I encouraged him to follow-up with ophthalmology in clinic.  Topical antibiotic drops.  9:09 PM:  I have discussed the diagnosis/risks/treatment options with the patient and family.  Evaluation and diagnostic testing in the emergency department does not suggest an emergent condition requiring admission or immediate intervention beyond what has been performed at this time.  They will follow up with Optho. We also discussed returning to the ED immediately if new or worsening sx occur. We discussed the sx which are most concerning (e.g., sudden worsening pain, fever, inability to tolerate by mouth, change in vision) that necessitate immediate return. Medications administered to the patient during their visit and any new prescriptions provided to the patient are listed below.  Medications given during this visit Medications  tetracaine  (PONTOCAINE) 0.5 % ophthalmic solution 2 drop (2 drops Left Eye Given 07/23/24 2054)  fluorescein  ophthalmic strip 1 strip (1 strip Left Eye Given 07/23/24 2054)     The patient appears reasonably screen and/or stabilized for discharge and I doubt any other medical condition or other [facility] requiring further screening, evaluation, or treatment in the ED at this time prior to discharge.       Final diagnoses:  Foreign body sensation, left eye    ED Discharge Orders          Ordered    trimethoprim -polymyxin b  (POLYTRIM ) ophthalmic solution  Every 4 hours        07/23/24 2106               Emil Share, DO 07/23/24 2109

## 2024-07-23 NOTE — Discharge Instructions (Signed)
 Use the eyedrops as prescribed.  Please follow-up with the ophthalmologist in the office.  Call them on Monday to try and set up an appointment.  Please return if there is any change in your vision.  Take 4 over the counter ibuprofen  tablets 3 times a day or 2 over-the-counter naproxen  tablets twice a day for pain. Also take tylenol  1000mg (2 extra strength) four times a day.

## 2024-07-23 NOTE — ED Triage Notes (Signed)
 Pt reports waking up last night with feeling of something in L eye.  Reports eye redness and swelling since this AM.

## 2024-11-18 ENCOUNTER — Ambulatory Visit: Admitting: Nurse Practitioner
